# Patient Record
Sex: Female | Born: 1995
Health system: Southern US, Community
[De-identification: ages and names within clinical notes are randomized; demographics above are authoritative.]

## PROBLEM LIST (undated history)

## (undated) ENCOUNTER — Ambulatory Visit (HOSPITAL_COMMUNITY): Disposition: A | Discharge status: Other Acute Inpatient Hospital | Payer: Medicaid Other

## (undated) DIAGNOSIS — N200 Calculus of kidney: Secondary | ICD-10-CM

## (undated) DIAGNOSIS — G43909 Migraine, unspecified, not intractable, without status migrainosus: Secondary | ICD-10-CM

## (undated) DIAGNOSIS — Z87442 Personal history of urinary calculi: Secondary | ICD-10-CM

## (undated) HISTORY — PX: WISDOM TOOTH EXTRACTION: SHX21

## (undated) HISTORY — DX: Migraine, unspecified, not intractable, without status migrainosus: G43.909

## (undated) HISTORY — PX: OTHER SURGICAL HISTORY: SHX169

---
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
## 2016-01-02 ENCOUNTER — Ambulatory Visit (HOSPITAL_COMMUNITY): Payer: Medicaid Other | Attending: Orthopaedic Surgery | Admitting: Physical Therapy

## 2016-01-02 DIAGNOSIS — R252 Cramp and spasm: Secondary | ICD-10-CM | POA: Insufficient documentation

## 2016-01-02 DIAGNOSIS — R293 Abnormal posture: Secondary | ICD-10-CM | POA: Insufficient documentation

## 2016-01-24 ENCOUNTER — Ambulatory Visit (HOSPITAL_COMMUNITY): Payer: Medicaid Other | Admitting: Physical Therapy

## 2016-01-24 DIAGNOSIS — R293 Abnormal posture: Secondary | ICD-10-CM | POA: Diagnosis not present

## 2016-01-24 DIAGNOSIS — R252 Cramp and spasm: Secondary | ICD-10-CM

## 2016-01-24 NOTE — Patient Instructions (Signed)
   SCAPULAR RETRACTIONS  Draw your shoulder blades back and down. It should feel like you are squeezing the muscles between your shoulder blades.  Hold for a count of 2, and then relax; repeat 15 times, twice a day.    Shoulder Rolls  Sit with arm resting on a table (or bed). Start with arm circles, focusing on moving the shoulder while keeping the arm resting on the table. Do a couple seconds circling backwards, then a few forwards.  Repeat 15 times with an up-back-down pattern, twice a day.    RETRACTION / CHIN TUCK  Slowly draw your head back so that your ears line up with your shoulders. It should feel like you are trying to make a "double chin".   Hold for 2 seconds when you are all the way tucked; repeat 15 times, twice a day.     UPPER TRAP STRETCH - HOLDING CHAIR  While sitting in a chair, hold the seat with one hand and bend your head towards the opposite side, then look back towards your hand, for a gentle stretch to the side of the neck.  Example: hold chair with left hand, bend head to your right and turn your head to the left at the same time.  Hold for 30 seconds, and perform twice each side, twice a day.    CORNER STRETCH  While standing at a corner of a wall, place your arms on the walls with elobws bent so that your upper arms are horizontal and your forearms are directed upwards as shown. Take one step forward towards the corner. Bend your front knee until a stretch is felt along the front of your chest and/or shoulders. Your arms should be pointed downward towards the ground.  NOTE: Your legs should control the stretch by bending or straightening your front knee.  Hold for 30 seconds, repeat 2 times, twice a day.

## 2016-01-24 NOTE — Therapy (Signed)
[facility] [address] [place], [place], [address] Phone: [phone]   Fax:  [phone]  Physical Therapy Evaluation  Patient Details  Name: Colleen Howard MRN: [id number] Date of Birth: 11/12/1995 Referring Provider: Eldred MangesMark C Yates   Encounter Date: 01/24/2016      PT End of Session - 01/24/16 0903    Visit Number 1   Number of Visits 6   Date for PT Re-Evaluation 02/21/16   Authorization Type Medicaid (submitted, waiting for auth)   Authorization Time Period 01/24/16 to 02/28/16   PT Start Time 0816   PT Stop Time 0855   PT Time Calculation (min) 39 min   Activity Tolerance Patient tolerated treatment well   Behavior [facility] for tasks assessed/performed      Past Medical History  Diagnosis Date  . Migraine headache     Past Surgical History  Procedure Laterality Date  . Tubes in ear      There were no vitals filed for this visit.       Subjective Assessment - 01/24/16 0819    Subjective Patient reports that she started having neck pain about 6 months ago; she got a shot from the doctor and it didn't help, [facility] [place] [facility] here. Her neck bothers her the most at night; sometimes she will wake up and it is bothering her very badly. She really does not ahve a "best" time for it, as it is always bothering her. She feels like her arm will go numb sometimes.    Pertinent History migranes, no other PMH    How long can you sit comfortably? has to pop it to be comfortable    Patient Stated Goals feel better    Currently in Pain? Yes   Pain Score 6    Pain Location Neck   Pain Orientation Right   Pain Descriptors / Indicators Other (Comment)  uanble to describe clearly, just states " I just have to pop it all the time"    Pain Type Chronic pain   Pain Radiating Towards going down into R shoulder    Pain Onset More than a month ago   Pain Frequency Constant   Aggravating Factors  not  sure    Pain Relieving Factors popping it, ibuprofen    Effect of Pain on Daily Activities no effect on daily activities             [facility] - 01/24/16 0001    Assessment   Medical Diagnosis neck pain    Referring Provider Eldred MangesMark C Yates    Onset Date/Surgical Date --  6 months ago    Next MD Visit May with Dr. Ophelia CharterYates    Precautions   Precautions None   Restrictions   Weight Bearing Restrictions No   Balance Screen   Has the patient fallen in the past 6 months No   Has the patient had a decrease in activity level because of a fear of falling?  No   Is the patient reluctant to leave their home because of a fear of falling?  No   Prior Function   Level of Independence Independent;Independent with basic ADLs;Independent with gait;Independent with transfers   Vocation Part time employment   Vocation Requirements CNA    Leisure none    Sensation   Light Touch Appears Intact   Posture/Postural Control   Posture/Postural Control Postural limitations   Postural  Limitations Rounded Shoulders;Forward head;Increased thoracic kyphosis   AROM   Overall AROM Comments mild scapular dyskinesia with flexion of shoulders    Right Shoulder Flexion --  WFL    Right Shoulder ABduction --  [facility]    Right Shoulder Internal Rotation --  approx T3    Right Shoulder External Rotation --  WFL    Left Shoulder Flexion --  [facility]    Left Shoulder ABduction --  [facility]    Left Shoulder Internal Rotation --  [facility]    Left Shoulder External Rotation --  approx T3    Cervical Flexion 65   Cervical Extension 40   Cervical - Right Side Bend 50  R upper trap pain    Cervical - Left Side Bend 50   Cervical - Right Rotation 90   Cervical - Left Rotation 90   Strength   Right Shoulder Flexion 4+/5   Right Shoulder ABduction 4+/5   Right Shoulder Internal Rotation 4/5   Right Shoulder External Rotation 4/5   Left Shoulder Flexion 4+/5   Left Shoulder ABduction 4+/5   Left Shoulder Internal  Rotation 4/5   Left Shoulder External Rotation 4/5   Cervical Flexion 4+/5   Cervical Extension 4+/5   Cervical - Right Side Bend 4+/5   Cervical - Left Side Bend 4+/5   Palpation   Palpation comment trigger points through scapular stabilziers; large multiple trigger points through bilateral upper traps, R cervical extensors             Trigger point release to bilateral upper traps and thoracic muslces; performed separately from all oteher Skilled interventiosn this session.                PT Education - 01/24/16 0903    Education provided Yes   Education Details Prognosis, plan of care, HEP    Person(s) Educated Patient   Methods Explanation;Demonstration;Handout   Comprehension Verbalized understanding;Returned demonstration;Need further instruction          PT Short Term Goals - 01/24/16 5409    PT SHORT TERM GOAL #1   Title Patietn to demonstrate improved functional posture at least 60% of the time ino rder to assist in reducing pain and improving overall function    Time 3   Period Weeks   Status New   PT SHORT TERM GOAL #2   Title Patient to report no more than 2/10 pain in her cervical area in order to demosntrate general improvement of condition    Time 3   Period Weeks   Status New   PT SHORT TERM GOAL #3   Title Patient to be independent in correct and appropriate use of moist heat in order to assist with increasing self-efficacy in managing condition    Time 3   Period Weeks   Status New   PT SHORT TERM GOAL #4   Title Patient to be independent in correctly and consistently performing appropriate HEP, to be updated PRN    Time 1   Period Days   Status New           PT Long Term Goals - 01/24/16 8119    PT LONG TERM GOAL #1   Title Patient to be able to maintain correct posture during all functional scenarios and sitautions at least 80% of the time in order to continue to assist in improving and managing symptoms    Time 6   Period  Weeks   Status New   PT  LONG TERM GOAL #2   Title Patient to demosntrate reduction in trigger points and muscle knotting in affected groups by at least 70% in order to reduce pain    Time 6   Period Weeks   Status New   PT LONG TERM GOAL #3   Title Patient to report return to full school and work activities with cervical pain no more than 1/10, no need for her to "pop" her neck to achieve relieif in order toa ssist in improving QOL    Time 6   Period Weeks   Status New               Plan - 01/24/16 0905    Clinical Impression Statement Patient arrives with complains of neck pain that she reports has been present for about the past 6 months and that she reports improves when she "pops" her neck. Upon examination, patient demonstrates signficant impairments in functional posture, severe trigger points in bilateral upper traps as well as some less extensive trigger points in thoracic musculature as well; in general, cervical and shoulder ROM appears intact and likely not influencing patient's symptoms at this point. Assigned appropriate HEP, also performed trigger point relesae to upper traps and cervical extensors today. At thsi point recommend brief stint of care with skilled PT servies in order to address functional limitations and assist in reaching optimal level of function.    Rehab Potential Excellent   PT Frequency 1x / week   PT Duration 6 weeks   PT Treatment/Interventions ADLs/Self Care Home Management;Moist Heat;Therapeutic activities;Therapeutic exercise;Patient/family education;Manual techniques;Taping   PT Next Visit Plan review HEP and goals; postural trainnig and strengthening; manual; rpogress HEP    PT Home Exercise Plan given    Consulted and Agree with Plan of Care Patient      Patient will benefit from skilled therapeutic intervention in order to improve the following deficits and impairments:  Pain, Increased fascial restricitons, Increased muscle spasms,  Improper body mechanics, Impaired flexibility, Postural dysfunction  Visit Diagnosis: Abnormal posture - Plan: PT plan of care cert/re-cert  Cramp and spasm - Plan: PT plan of care cert/re-cert     Problem List Patient Active Problem List   Diagnosis Date Noted  . Migraines 07/07/2013    Nedra Hai PT, DPT [phone]  [facility] [address] [place], [place], [address] Phone: [phone]   Fax:  [phone]  Name: Colleen Howard MRN: [id number] Date of Birth: 06/17/1996

## 2016-02-06 ENCOUNTER — Telehealth (HOSPITAL_COMMUNITY): Payer: Self-pay

## 2016-02-06 ENCOUNTER — Ambulatory Visit (HOSPITAL_COMMUNITY): Payer: Medicaid Other | Attending: Orthopaedic Surgery

## 2016-02-06 DIAGNOSIS — R293 Abnormal posture: Secondary | ICD-10-CM

## 2016-02-06 DIAGNOSIS — R252 Cramp and spasm: Secondary | ICD-10-CM | POA: Diagnosis present

## 2016-02-06 NOTE — Telephone Encounter (Signed)
No show, called and spoke to pt.'s mother who stated she had forgotten.  Rescheduled apt for 10:30 am later today.  [address]Colleen Howard, LPTA; CBIS [phone]

## 2016-02-06 NOTE — Therapy (Signed)
[facility] [address] [place], [place], [address] Phone: [phone]   Fax:  [phone]  Physical Therapy Treatment  Patient Details  Name: Colleen Howard MRN: [id number] Date of Birth: 1996/07/09 Referring Provider: Eldred Manges   Encounter Date: 02/06/2016      PT End of Session - 02/06/16 1035    Visit Number 2   Number of Visits 6   Date for PT Re-Evaluation 02/21/16   Authorization Type Medicaid prior authorization from 01/29/2016-03/10/2016 6 units   Authorization Time Period 01/24/16 to 02/28/16   PT Start Time 1030   PT Stop Time 1112   PT Time Calculation (min) 42 min   Activity Tolerance Patient tolerated treatment well   Behavior During Therapy [facility] for tasks assessed/performed      Past Medical History  Diagnosis Date  . Migraine headache     Past Surgical History  Procedure Laterality Date  . Tubes in ear      There were no vitals filed for this visit.      Subjective Assessment - 02/06/16 1030    Subjective Pt stated compliance with HEP 2-3 times a week with no questions concerning exercises.  Current pain scale 5-6/10 Rt side of neck.   Pertinent History migranes, no other PMH    Patient Stated Goals feel better    Currently in Pain? Yes   Pain Score 6    Pain Location Neck   Pain Orientation Right   Pain Descriptors / Indicators Sore;Aching   Pain Type Chronic pain   Pain Radiating Towards Top of Rt shoulder   Pain Onset More than a month ago   Pain Frequency Constant   Aggravating Factors  not sure   Pain Relieving Factors popping it, ibuprofen   Effect of Pain on Daily Activities no effect on daily activities              [facility] Adult PT Treatment/Exercise - 02/06/16 0001    Exercises   Exercises Neck   Neck Exercises: Theraband   Scapula Retraction 10 reps;Red   Shoulder Extension 10 reps;Red   Rows 10 reps;Red   Neck Exercises: Seated   Neck Retraction 15 reps;5 secs   Shoulder  Rolls Backwards;10 reps   Shoulder Rolls Limitations up, back and relax   Postural Training Educated importance and proper landmarks   Other Seated Exercise Scapular retraction   Manual Therapy   Manual Therapy Soft tissue mobilization   Manual therapy comments complete separate from rest of treatment   Soft tissue mobilization Supine positive with LE elevated STM to Rt> Lt Upper traps and levator scapula; position release technqiue to Rt upper trap   Neck Exercises: Stretches   Upper Trapezius Stretch 3 reps;30 seconds   Corner Stretch 3 reps;30 seconds                  PT Short Term Goals - 01/24/16 6962    PT SHORT TERM GOAL #1   Title Patietn to demonstrate improved functional posture at least 60% of the time ino rder to assist in reducing pain and improving overall function    Time 3   Period Weeks   Status New   PT SHORT TERM GOAL #2   Title Patient to report no more than 2/10 pain in her cervical area in order to demosntrate general improvement of condition    Time 3   Period Weeks   Status New   PT SHORT  TERM GOAL #3   Title Patient to be independent in correct and appropriate use of moist heat in order to assist with increasing self-efficacy in managing condition    Time 3   Period Weeks   Status New   PT SHORT TERM GOAL #4   Title Patient to be independent in correctly and consistently performing appropriate HEP, to be updated PRN    Time 1   Period Days   Status New           PT Long Term Goals - 01/24/16 [id number]    PT LONG TERM GOAL #1   Title Patient to be able to maintain correct posture during all functional scenarios and sitautions at least 80% of the time in order to continue to assist in improving and managing symptoms    Time 6   Period Weeks   Status New   PT LONG TERM GOAL #2   Title Patient to demosntrate reduction in trigger points and muscle knotting in affected groups by at least 70% in order to reduce pain    Time 6   Period Weeks    Status New   PT LONG TERM GOAL #3   Title Patient to report return to full school and work activities with cervical pain no more than 1/10, no need for her to "pop" her neck to achieve relieif in order toa ssist in improving QOL    Time 6   Period Weeks   Status New               Plan - 02/06/16 1057    Clinical Impression Statement Reviewed goals, compliance and assured correct technique with HEP and copy of eval given to pt.  Pt educated on importance of proper posture with verbal and tactile cueing for proper landmarks.  Progressed to theraband postural strengthening and UBE backwards for postural strengthening.  Pt encouraged to increase frequency with HEP weekly for maximal benefits.  Ended session with manual soft tissue mobilization to reduce spasms and overall tightness Rt upper trap and levator scapula.  Pt reports pain reduction to 4/10 at end of session.     Rehab Potential Excellent   PT Frequency 1x / week   PT Duration 6 weeks   PT Treatment/Interventions ADLs/Self Care Home Management;Moist Heat;Therapeutic activities;Therapeutic exercise;Patient/family education;Manual techniques;Taping   PT Next Visit Plan Progress postural strengthening with prone exercises next session; stretches for mobility, manual       Patient will benefit from skilled therapeutic intervention in order to improve the following deficits and impairments:  Pain, Increased fascial restricitons, Increased muscle spasms, Improper body mechanics, Impaired flexibility, Postural dysfunction  Visit Diagnosis: Abnormal posture  Cramp and spasm     Problem List Patient Active Problem List   Diagnosis Date Noted  . Migraines 07/07/2013   Becky Saxasey [place], LPTA; CBIS [phone]  Colleen Howard, Colleen Howard 02/06/2016, 12:15 PM  [facility] [address] [place], [place], [address] Phone: [phone]   Fax:  [phone]  Name: Colleen Howard MRN:  [id number] Date of Birth: 01/13/1996

## 2016-02-13 ENCOUNTER — Encounter (HOSPITAL_COMMUNITY): Payer: Medicaid Other

## 2016-02-20 ENCOUNTER — Ambulatory Visit (HOSPITAL_COMMUNITY): Payer: Medicaid Other

## 2016-02-20 ENCOUNTER — Telehealth (HOSPITAL_COMMUNITY): Payer: Self-pay

## 2016-02-20 NOTE — Telephone Encounter (Signed)
No show, called and left about missed apt, included next apt date and time with contact info given.    [address]Earl Zellmer, LPTA; CBIS [phone]

## 2016-02-26 ENCOUNTER — Telehealth: Payer: Self-pay | Admitting: Family Medicine

## 2016-02-26 ENCOUNTER — Other Ambulatory Visit: Payer: Self-pay | Admitting: Nurse Practitioner

## 2016-02-26 NOTE — Telephone Encounter (Signed)
cetirizine (ZYRTEC) 10 MG tablet  [facility] to [facility]

## 2016-02-26 NOTE — Telephone Encounter (Signed)
[facility] via [facility] request)

## 2016-02-27 ENCOUNTER — Telehealth (HOSPITAL_COMMUNITY): Payer: Self-pay

## 2016-02-27 ENCOUNTER — Ambulatory Visit (HOSPITAL_COMMUNITY): Payer: Self-pay | Admitting: Physical Therapy

## 2016-02-27 MED ORDER — CETIRIZINE HCL 10 MG PO TABS: ORAL_TABLET | ORAL | Status: DC

## 2016-02-27 NOTE — Telephone Encounter (Signed)
02/27/16 mom left a message that she was sick

## 2016-02-27 NOTE — Telephone Encounter (Signed)
Spoke with patient's mother and informed her refills were sent over to pharmacy. Patient's mother verbalized understanding.

## 2016-02-28 ENCOUNTER — Ambulatory Visit (INDEPENDENT_AMBULATORY_CARE_PROVIDER_SITE_OTHER): Payer: Medicaid Other

## 2016-02-28 ENCOUNTER — Telehealth: Payer: Self-pay | Admitting: Family Medicine

## 2016-02-28 ENCOUNTER — Ambulatory Visit: Payer: Medicaid Other

## 2016-02-28 DIAGNOSIS — Z3042 Encounter for surveillance of injectable contraceptive: Secondary | ICD-10-CM | POA: Diagnosis not present

## 2016-02-28 MED ORDER — MEDROXYPROGESTERONE ACETATE 150 MG/ML IM SUSP
150.0000 mg | Freq: Once | INTRAMUSCULAR | Status: AC
  Administered 2016-02-28: 150 mg via INTRAMUSCULAR

## 2016-02-28 NOTE — Telephone Encounter (Signed)
Spoke with [facility], mom tried to have filled yesterday, but today is the first day that it could be filled.  I notified Olegario MessierKathy and rescheduled appt.

## 2016-02-28 NOTE — Telephone Encounter (Signed)
Yes those are the correct dates. 

## 2016-02-28 NOTE — Telephone Encounter (Signed)
Patient had appointment today to have depo, but pharmacy states it is too early to have filled so mom cancelled.  The dates for the depo we have down are June 1-June 15.  Are these the correct dates?   ° °[facility] °

## 2016-03-01 ENCOUNTER — Encounter (HOSPITAL_COMMUNITY): Payer: Self-pay

## 2016-03-01 ENCOUNTER — Emergency Department (HOSPITAL_COMMUNITY)
Admission: EM | Admit: 2016-03-01 | Discharge: 2016-03-02 | Disposition: A | Discharge status: Home / Self Care | Payer: Medicaid Other | Attending: Emergency Medicine | Admitting: Emergency Medicine

## 2016-03-01 DIAGNOSIS — B9689 Other specified bacterial agents as the cause of diseases classified elsewhere: Secondary | ICD-10-CM

## 2016-03-01 DIAGNOSIS — R112 Nausea with vomiting, unspecified: Secondary | ICD-10-CM | POA: Insufficient documentation

## 2016-03-01 DIAGNOSIS — N760 Acute vaginitis: Secondary | ICD-10-CM | POA: Insufficient documentation

## 2016-03-01 DIAGNOSIS — R319 Hematuria, unspecified: Secondary | ICD-10-CM | POA: Diagnosis not present

## 2016-03-01 DIAGNOSIS — R1032 Left lower quadrant pain: Secondary | ICD-10-CM | POA: Diagnosis present

## 2016-03-01 DIAGNOSIS — R102 Pelvic and perineal pain: Secondary | ICD-10-CM

## 2016-03-01 LAB — URINE MICROSCOPIC-ADD ON

## 2016-03-01 LAB — URINALYSIS, ROUTINE W REFLEX MICROSCOPIC
BILIRUBIN URINE: NEGATIVE
GLUCOSE, UA: NEGATIVE mg/dL
KETONES UR: NEGATIVE mg/dL
Leukocytes, UA: NEGATIVE
NITRITE: NEGATIVE
PH: 7 (ref 5.0–8.0)
Protein, ur: 30 mg/dL — AB
SPECIFIC GRAVITY, URINE: 1.02 (ref 1.005–1.030)

## 2016-03-01 LAB — WET PREP, GENITAL
SPERM: NONE SEEN
Trich, Wet Prep: NONE SEEN
YEAST WET PREP: NONE SEEN

## 2016-03-01 LAB — PREGNANCY, URINE: Preg Test, Ur: NEGATIVE

## 2016-03-01 MED ORDER — OXYCODONE-ACETAMINOPHEN 5-325 MG PO TABS
1.0000 | ORAL_TABLET | Freq: Once | ORAL | Status: AC
  Administered 2016-03-01: 1 via ORAL
  Filled 2016-03-01: qty 1

## 2016-03-01 NOTE — ED Provider Notes (Signed)
CSN: [id number]     Arrival date & time 03/01/16  2226 History  By signing my name below, I, [facility], attest that this documentation has been prepared under the direction and in the presence of Glynn Octave, MD. Electronically Signed: Randell Patient, ED Scribe. 03/02/2016. 12:58 AM.   Chief Complaint  Patient presents with  . Abdominal Pain    The history is provided by the patient. No language interpreter was used.   HPI Comments: Colleen Howard is a 20 y.o. female who presents to the Emergency Department complaining of constant, unchanging, severe abdominal pain onset 1.5 hours ago while the pt was at work.  Pt states that she felt mild pain in her abdomen after which she vomited once followed by an increase in her abdominal pain and then by multiple episodes of emesis. She reports associated nausea and diarrhea. Pain is worse with emesis and improved by nothing. She notes that she is sexually active and is on birth control shot. She is allergic to Bactrim, codeine, and hydrocodone. Denies similar symptoms in the past. Denies hx of ovarian cysts and pregnancy. Denies vaginal bleeding, vaginal discharge, hematuria, dysuria, fevers, back pain, or any other symptoms currently.   Past Medical History  Diagnosis Date  . Migraine headache    Past Surgical History  Procedure Laterality Date  . Tubes in ear     No family history on file. Social History  Substance Use Topics  . Smoking status: Never Smoker   . Smokeless tobacco: None  . Alcohol Use: No   OB History    No data available     Review of Systems A complete 10 system review of systems was obtained and all systems are negative except as noted in the HPI and PMH.    Allergies  Bactrim; Codeine; and Hydrocodone  Home Medications   Prior to Admission medications   Medication Sig Start Date End Date Taking? Authorizing Provider  azithromycin (ZITHROMAX Z-PAK) 250 MG tablet Take 2 tablets (500 mg) on  Day  1,  followed by 1 tablet (250 mg) once daily on Days 2 through 5. 09/19/15   Babs Sciara, MD  cetirizine (ZYRTEC) 10 MG tablet TAKE 1 TABLET BY MOUTH ONCE DAILY AS NEEDED FOR ALLERGIES. 02/27/16   Merlyn Albert, MD  chlorzoxazone (PARAFON) 500 MG tablet Take 1 tablet (500 mg total) by mouth 3 (three) times daily as needed for muscle spasms. 05/31/15   Campbell Riches, NP  MedroxyPROGESTERone Acetate 150 MG/ML SUSY INJECT INTRAMUSCULARLY EVERY 3 MONTH. 12/13/15   Babs Sciara, MD  meloxicam (MOBIC) 15 MG tablet Take 1 tablet (15 mg total) by mouth daily. Prn pain 06/01/15   Campbell Riches, NP  metroNIDAZOLE (FLAGYL) 500 MG tablet Take 1 tablet (500 mg total) by mouth 3 (three) times daily. 03/02/16   Glynn Octave, MD  ondansetron (ZOFRAN) 4 MG tablet Take 1 tablet (4 mg total) by mouth every 6 (six) hours. 03/02/16   Glynn Octave, MD  ZOLMitriptan (ZOMIG) 2.5 MG tablet Take 1 tablet (2.5 mg total) by mouth as needed for migraine. 07/07/13   Babs Sciara, MD   BP 114/65 mmHg  Pulse 65  Temp(Src) 97.8 F (36.6 C) (Oral)  Resp 18  Ht 5\' 4"  (1.626 m)  Wt 110 lb (49.896 kg)  BMI 18.87 kg/m2  SpO2 100%  LMP  (Approximate) Physical Exam  Constitutional: She is oriented to person, place, and time. She appears well-developed and well-nourished.  No distress.  Appears comfortable.  HENT:  Head: Normocephalic and atraumatic.  Mouth/Throat: Oropharynx is clear and moist. No oropharyngeal exudate.  Eyes: Conjunctivae and EOM are normal. Pupils are equal, round, and reactive to light.  Neck: Normal range of motion. Neck supple.  No meningismus.  Cardiovascular: Normal rate, regular rhythm, normal heart sounds and intact distal pulses.   No murmur heard. Pulmonary/Chest: Effort normal and breath sounds normal. No respiratory distress.  Abdominal: Soft. There is tenderness in the left lower quadrant. There is no rebound, no guarding and no CVA tenderness.  LLQ tenderness. No guarding,.  Rebound, or CVA tenderness.  Genitourinary:  Chaperone present. Normal external genitalia. No vaginal discharge or blood in vaginal vault. No CMT. Left adnexal tenderness. No uterine tenderness.  Musculoskeletal: Normal range of motion. She exhibits no edema or tenderness.  Neurological: She is alert and oriented to person, place, and time. No cranial nerve deficit. She exhibits normal muscle tone. Coordination normal.  No ataxia on finger to nose bilaterally. No pronator drift. 5/5 strength throughout. CN 2-12 intact.Equal grip strength. Sensation intact.   Skin: Skin is warm.  Psychiatric: She has a normal mood and affect. Her behavior is normal.  Nursing note and vitals reviewed.   ED Course  Procedures   DIAGNOSTIC STUDIES: Oxygen Saturation is 100% on RA, normal by my interpretation.    COORDINATION OF CARE: 11:24 PM Will order Percocet, labs, and [place] of transvaginal and abdomen. Will return to perform pelvic exam. Discussed treatment plan with pt at bedside and pt agreed to plan.  11:39 PM Returned to perform pelvic exam. Will order US Pelvis.  Labs Review Labs Reviewed  WET PREP, GENITAL - Abnormal; Notable for the following:    Clue Cells Wet Prep HPF POC PRESENT (*)    WBC, Wet Prep HPF POC MANY (*)    All other components within normal limits  URINALYSIS, ROUTINE W REFLEX MICROSCOPIC (NOT AT [facility]) - Abnormal; Notable for the following:    APPearance CLOUDY (*)    Hgb urine dipstick LARGE (*)    Protein, ur 30 (*)    All other components within normal limits  URINE MICROSCOPIC-ADD ON - Abnormal; Notable for the following:    Squamous Epithelial / LPF 6-30 (*)    Bacteria, UA RARE (*)    All other components within normal limits  PREGNANCY, URINE  GC/CHLAMYDIA PROBE AMP ([place]) NOT AT [facility]    Imaging Review US Transvaginal Non-ob  03/02/2016  CLINICAL DATA:  Acute onset of left lower quadrant abdominal pain, nausea and vomiting. Initial encounter. EXAM:  TRANSABDOMINAL AND TRANSVAGINAL ULTRASOUND OF PELVIS DOPPLER ULTRASOUND OF OVARIES TECHNIQUE: Both transabdominal and transvaginal ultrasound examinations of the pelvis were performed. Transabdominal technique was performed for global imaging of the pelvis including uterus, ovaries, adnexal regions, and pelvic cul-de-sac. It was necessary to proceed with endovaginal exam following the transabdominal exam to visualize the uterus and ovaries in greater detail. Color and duplex Doppler ultrasound was utilized to evaluate blood flow to the ovaries. COMPARISON:  None. FINDINGS: Uterus Measurements: 5.8 x 2.9 x 3.8 cm. No fibroids or other mass visualized. Endometrium Thickness: 0.3 cm.  No focal abnormality visualized. Right ovary Measurements: 2.4 x 1.8 x 2.2 cm. Normal appearance/no adnexal mass. Left ovary Measurements: 3.6 x 1.7 x 2.4 cm. Normal appearance/no adnexal mass. Pulsed Doppler evaluation of both ovaries demonstrates normal low-resistance arterial and venous waveforms. Other findings No abnormal free fluid. IMPRESSION: Unremarkable pelvic ultrasound.  No evidence  for ovarian torsion. Electronically Signed   By: Roanna RaiderJeffery  Chang M.D.   On: 03/02/2016 02:11   [place] Pelvis Complete  03/02/2016  CLINICAL DATA:  Acute onset of left lower quadrant abdominal pain, nausea and vomiting. Initial encounter. EXAM: TRANSABDOMINAL AND TRANSVAGINAL ULTRASOUND OF PELVIS DOPPLER ULTRASOUND OF OVARIES TECHNIQUE: Both transabdominal and transvaginal ultrasound examinations of the pelvis were performed. Transabdominal technique was performed for global imaging of the pelvis including uterus, ovaries, adnexal regions, and pelvic cul-de-sac. It was necessary to proceed with endovaginal exam following the transabdominal exam to visualize the uterus and ovaries in greater detail. Color and duplex Doppler ultrasound was utilized to evaluate blood flow to the ovaries. COMPARISON:  None. FINDINGS: Uterus Measurements: 5.8 x 2.9 x 3.8  cm. No fibroids or other mass visualized. Endometrium Thickness: 0.3 cm.  No focal abnormality visualized. Right ovary Measurements: 2.4 x 1.8 x 2.2 cm. Normal appearance/no adnexal mass. Left ovary Measurements: 3.6 x 1.7 x 2.4 cm. Normal appearance/no adnexal mass. Pulsed Doppler evaluation of both ovaries demonstrates normal low-resistance arterial and venous waveforms. Other findings No abnormal free fluid. IMPRESSION: Unremarkable pelvic ultrasound.  No evidence for ovarian torsion. Electronically Signed   By: Roanna RaiderJeffery  Chang M.D.   On: 03/02/2016 02:11   [place] Art/ven Flow Abd Pelv Doppler  03/02/2016  CLINICAL DATA:  Acute onset of left lower quadrant abdominal pain, nausea and vomiting. Initial encounter. EXAM: TRANSABDOMINAL AND TRANSVAGINAL ULTRASOUND OF PELVIS DOPPLER ULTRASOUND OF OVARIES TECHNIQUE: Both transabdominal and transvaginal ultrasound examinations of the pelvis were performed. Transabdominal technique was performed for global imaging of the pelvis including uterus, ovaries, adnexal regions, and pelvic cul-de-sac. It was necessary to proceed with endovaginal exam following the transabdominal exam to visualize the uterus and ovaries in greater detail. Color and duplex Doppler ultrasound was utilized to evaluate blood flow to the ovaries. COMPARISON:  None. FINDINGS: Uterus Measurements: 5.8 x 2.9 x 3.8 cm. No fibroids or other mass visualized. Endometrium Thickness: 0.3 cm.  No focal abnormality visualized. Right ovary Measurements: 2.4 x 1.8 x 2.2 cm. Normal appearance/no adnexal mass. Left ovary Measurements: 3.6 x 1.7 x 2.4 cm. Normal appearance/no adnexal mass. Pulsed Doppler evaluation of both ovaries demonstrates normal low-resistance arterial and venous waveforms. Other findings No abnormal free fluid. IMPRESSION: Unremarkable pelvic ultrasound.  No evidence for ovarian torsion. Electronically Signed   By: Roanna RaiderJeffery  Chang M.D.   On: 03/02/2016 02:11   Ct Renal Stone Study  03/02/2016   CLINICAL DATA:  Acute onset of severe generalized abdominal pain and vomiting. Nausea and diarrhea. Initial encounter. EXAM: CT ABDOMEN AND PELVIS WITHOUT CONTRAST TECHNIQUE: Multidetector CT imaging of the abdomen and pelvis was performed following the standard protocol without IV contrast. COMPARISON:  Pelvic ultrasound performed earlier today at 1:14 a.m. FINDINGS: The visualized lung bases are clear. The liver and spleen are unremarkable in appearance. The gallbladder is within normal limits. The pancreas and adrenal glands are unremarkable. The kidneys are unremarkable in appearance. There is no evidence of hydronephrosis. No renal or ureteral stones are seen. No perinephric stranding is appreciated. No free fluid is identified. The small bowel is unremarkable in appearance. The stomach is within normal limits. No acute vascular abnormalities are seen. The appendix the appendix is normal in caliber, without evidence of appendicitis. The colon is grossly unremarkable. The bladder is mildly distended and grossly unremarkable. A small urachal remnant is incidentally seen. The uterus is unremarkable in appearance. The ovaries are relatively symmetric. No suspicious adnexal masses are seen.  No inguinal lymphadenopathy is seen. No acute osseous abnormalities are identified. IMPRESSION: No acute abnormality seen within the abdomen or pelvis. Electronically Signed   By: Roanna Raider M.D.   On: 03/02/2016 03:24   I have personally reviewed and evaluated these images and lab results as part of my medical decision-making.   EKG Interpretation None      MDM   Final diagnoses:  Pelvic pain in female  Hematuria  Nausea and vomiting, vomiting of unspecified type  Bacterial vaginosis   Sudden onset left-sided pelvic pain with nausea and vomiting. No urinary or vaginal symptoms.   Ultrasound will need to obtained to rule out torsion. HCG negative.  UA with hematuria. Clue cells on wet prep.  Ultrasound  was negative for torsion or other pathology. Pain is improved. Patient is tolerating by mouth. Given hematuria on urinalysis, CT scan obtained looking for kidney stone.  CT is negative. Consider passed kidney stone. No evidence of ovarian torsion or TOA. No evidence of UTI. Patient is tolerating by mouth without vomiting in the ED. Treat symptomatically. Treat BV. Follow-up with PCP. Return precautions discussed.   I personally performed the services described in this documentation, which was scribed in my presence. The recorded information has been reviewed and is accurate.       Glynn Octave, MD 03/02/16 [phone]

## 2016-03-01 NOTE — ED Notes (Signed)
Patient complaining of nausea and vomiting.  She was working at [employer] and started having severe abdominal pain, then started vomiting.  Started suddenly.  Left lower abdominal pain that started suddenly.  Patient is sexually active, denies pregnancy.  No history of ovarian cysts.

## 2016-03-02 ENCOUNTER — Emergency Department (HOSPITAL_COMMUNITY): Payer: Medicaid Other

## 2016-03-02 MED ORDER — ONDANSETRON HCL 4 MG PO TABS: 4.0000 mg | ORAL_TABLET | Freq: Four times a day (QID) | ORAL | Status: DC

## 2016-03-02 MED ORDER — METRONIDAZOLE 500 MG PO TABS
500.0000 mg | ORAL_TABLET | Freq: Three times a day (TID) | ORAL | Status: DC
Start: 2016-03-02 — End: 2017-06-30

## 2016-03-02 NOTE — ED Notes (Signed)
Pt provided with ginger ale 

## 2016-03-02 NOTE — ED Notes (Signed)
No vomiting after drinking ginger ale

## 2016-03-02 NOTE — Discharge Instructions (Signed)
Abdominal Pain, Adult It is possible you passed a kidney stone. The blood supply to your ovary is normal.Take the nausea medication as prescribed. Follow-up with your doctor. Return to the ED if you develop new or worsening symptoms. Many things can cause abdominal pain. Usually, abdominal pain is not caused by a disease and will improve without treatment. It can often be observed and treated at home. Your health care provider will do a physical exam and possibly order blood tests and X-rays to help determine the seriousness of your pain. However, in many cases, more time must pass before a clear cause of the pain can be found. Before that point, your health care provider may not know if you need more testing or further treatment. HOME CARE INSTRUCTIONS Monitor your abdominal pain for any changes. The following actions may help to alleviate any discomfort you are experiencing:  Only take over-the-counter or prescription medicines as directed by your health care provider.  Do not take laxatives unless directed to do so by your health care provider.  Try a clear liquid diet (broth, tea, or water) as directed by your health care provider. Slowly move to a bland diet as tolerated. SEEK MEDICAL CARE IF:  You have unexplained abdominal pain.  You have abdominal pain associated with nausea or diarrhea.  You have pain when you urinate or have a bowel movement.  You experience abdominal pain that wakes you in the night.  You have abdominal pain that is worsened or improved by eating food.  You have abdominal pain that is worsened with eating fatty foods.  You have a fever. SEEK IMMEDIATE MEDICAL CARE IF:  Your pain does not go away within 2 hours.  You keep throwing up (vomiting).  Your pain is felt only in portions of the abdomen, such as the right side or the left lower portion of the abdomen.  You pass bloody or black tarry stools. MAKE SURE YOU:  Understand these instructions.  Will  watch your condition.  Will get help right away if you are not doing well or get worse.   This information is not intended to replace advice given to you by your health care provider. Make sure you discuss any questions you have with your health care provider.   Document Released: 06/25/2005 Document Revised: 06/06/2015 Document Reviewed: 05/25/2013 Elsevier Interactive Patient Education [employer]2016 Elsevier Inc.

## 2016-03-03 ENCOUNTER — Telehealth (HOSPITAL_COMMUNITY): Payer: Self-pay

## 2016-03-03 ENCOUNTER — Ambulatory Visit (HOSPITAL_COMMUNITY): Payer: Medicaid Other | Admitting: Physical Therapy

## 2016-03-03 LAB — GC/CHLAMYDIA PROBE AMP (~~LOC~~) NOT AT ARMC
Chlamydia: NEGATIVE
NEISSERIA GONORRHEA: NEGATIVE

## 2016-03-03 NOTE — Telephone Encounter (Signed)
03/03/16 mom called to cx said that she has kidney stones

## 2016-05-12 ENCOUNTER — Other Ambulatory Visit: Payer: Self-pay | Admitting: Nurse Practitioner

## 2016-05-15 ENCOUNTER — Ambulatory Visit (INDEPENDENT_AMBULATORY_CARE_PROVIDER_SITE_OTHER): Payer: Medicaid Other

## 2016-05-15 ENCOUNTER — Other Ambulatory Visit: Payer: Self-pay | Admitting: Nurse Practitioner

## 2016-05-15 DIAGNOSIS — Z3042 Encounter for surveillance of injectable contraceptive: Secondary | ICD-10-CM | POA: Diagnosis not present

## 2016-05-15 MED ORDER — MEDROXYPROGESTERONE ACETATE 150 MG/ML IM SUSP
150.0000 mg | Freq: Once | INTRAMUSCULAR | Status: AC
  Administered 2016-05-15: 150 mg via INTRAMUSCULAR

## 2016-05-15 MED ORDER — CETIRIZINE HCL 10 MG PO TABS: ORAL_TABLET | ORAL | 5 refills | Status: DC

## 2016-06-24 ENCOUNTER — Ambulatory Visit: Payer: Medicaid Other

## 2016-07-29 ENCOUNTER — Other Ambulatory Visit: Payer: Self-pay | Admitting: Nurse Practitioner

## 2016-07-31 ENCOUNTER — Ambulatory Visit: Payer: Medicaid Other | Admitting: Family Medicine

## 2016-07-31 ENCOUNTER — Ambulatory Visit: Payer: Medicaid Other

## 2016-07-31 ENCOUNTER — Ambulatory Visit (INDEPENDENT_AMBULATORY_CARE_PROVIDER_SITE_OTHER): Payer: Medicaid Other | Admitting: *Deleted

## 2016-07-31 DIAGNOSIS — Z309 Encounter for contraceptive management, unspecified: Secondary | ICD-10-CM | POA: Diagnosis not present

## 2016-07-31 MED ORDER — MEDROXYPROGESTERONE ACETATE 150 MG/ML IM SUSP
150.0000 mg | Freq: Once | INTRAMUSCULAR | Status: AC
  Administered 2016-07-31: 150 mg via INTRAMUSCULAR

## 2016-10-16 ENCOUNTER — Ambulatory Visit: Payer: Medicaid Other

## 2016-10-16 ENCOUNTER — Other Ambulatory Visit: Payer: Self-pay | Admitting: Nurse Practitioner

## 2016-11-24 ENCOUNTER — Ambulatory Visit: Payer: Medicaid Other | Admitting: Family Medicine

## 2016-11-26 MED FILL — POLYMYXIN B/TMP EYE DROPS: 10000-0.1 | 16 days supply | Qty: 10 | Fill #0

## 2016-12-08 MED FILL — ZYLET EYE DROPS: 0.5-0.3 | 25 days supply | Qty: 5 | Fill #0

## 2017-01-15 ENCOUNTER — Ambulatory Visit (INDEPENDENT_AMBULATORY_CARE_PROVIDER_SITE_OTHER): Payer: Medicaid Other

## 2017-01-15 DIAGNOSIS — Z3042 Encounter for surveillance of injectable contraceptive: Secondary | ICD-10-CM | POA: Diagnosis not present

## 2017-01-15 LAB — POCT URINE PREGNANCY: PREG TEST UR: NEGATIVE

## 2017-01-15 MED ORDER — MEDROXYPROGESTERONE ACETATE 150 MG/ML IM SUSP
150.0000 mg | Freq: Once | INTRAMUSCULAR | Status: AC
  Administered 2017-01-15: 150 mg via INTRAMUSCULAR

## 2017-04-03 ENCOUNTER — Ambulatory Visit: Payer: Medicaid Other

## 2017-04-07 ENCOUNTER — Ambulatory Visit (INDEPENDENT_AMBULATORY_CARE_PROVIDER_SITE_OTHER): Payer: Medicaid Other | Admitting: *Deleted

## 2017-04-07 DIAGNOSIS — Z309 Encounter for contraceptive management, unspecified: Secondary | ICD-10-CM

## 2017-04-07 MED ORDER — MEDROXYPROGESTERONE ACETATE 150 MG/ML IM SUSP
150.0000 mg | Freq: Once | INTRAMUSCULAR | Status: AC
  Administered 2017-04-07: 150 mg via INTRAMUSCULAR

## 2017-04-08 ENCOUNTER — Telehealth: Payer: Self-pay | Admitting: *Deleted

## 2017-04-08 NOTE — Telephone Encounter (Signed)
Pt came in to get her depo provera shot and she states she heard depo provera could cause stress fractures. She wants to know if she should get on birth control pill. Please advise. [facility].  

## 2017-04-09 NOTE — Telephone Encounter (Signed)
Eber Jonesarolyn, I cannot close because it is your incomplete note. It will not let me edit your note.

## 2017-04-09 NOTE — Telephone Encounter (Signed)
Left message to return call 

## 2017-04-09 NOTE — Telephone Encounter (Signed)
Discussed with pt. Pt verbalized understanding.  °

## 2017-04-09 NOTE — Telephone Encounter (Signed)
Toniann FailWendy, cancel incomplete note. I reviewed all the latest data. First, there is no evidence that use of Depo Provera for someone your age increases risk of osteoporosis at a later age. Gynecologists say benefits greatly outweigh any risks. Although there is a warning about bone loss, it is increased in certain groups of women but does not apply to you. Hope this helps.

## 2017-06-30 ENCOUNTER — Encounter: Payer: Self-pay | Admitting: Family Medicine

## 2017-06-30 ENCOUNTER — Ambulatory Visit (INDEPENDENT_AMBULATORY_CARE_PROVIDER_SITE_OTHER): Payer: Self-pay | Admitting: Family Medicine

## 2017-06-30 VITALS — BP 112/78 | Temp 98.5°F | Ht 63.0 in | Wt 111.0 lb

## 2017-06-30 DIAGNOSIS — B9689 Other specified bacterial agents as the cause of diseases classified elsewhere: Secondary | ICD-10-CM

## 2017-06-30 DIAGNOSIS — J029 Acute pharyngitis, unspecified: Secondary | ICD-10-CM

## 2017-06-30 DIAGNOSIS — J019 Acute sinusitis, unspecified: Secondary | ICD-10-CM

## 2017-06-30 LAB — POCT RAPID STREP A (OFFICE): Rapid Strep A Screen: NEGATIVE

## 2017-06-30 MED ORDER — AZITHROMYCIN 250 MG PO TABS: ORAL_TABLET | ORAL | 0 refills | Status: DC

## 2017-06-30 NOTE — Progress Notes (Signed)
Subjective:     Patient ID: Colleen Howard, female   DOB: [birth year], 21 y.o.   MRN: [id number]  Sore Throat     Patient states she started having symptoms of sore throat three day. It hurts to swallow.Ran fever yesterday.Taking allergy medications and Advil for it and they have not helped; No other concerns. Review of Systems Took advil and allergy meds, did not help  Low gr fever   Gunky nose   Front   [employer]     Results for orders placed or performed in visit on 06/30/17  POCT rapid strep A  Result Value Ref Range   Rapid Strep A Screen Negative Negative     Objective:   Physical Exam lert active mild nasal congestion thanks erythematous tender anterior nodes lungs clear. Heart    Assessment:     Rhinosinusitis/pharyngitis with cervical lymphadenitis plan antibiotics prescribed symptom care discussed    Plan:

## 2017-07-01 LAB — PLEASE NOTE

## 2017-07-01 LAB — STREP A DNA PROBE: Strep Gp A Direct, DNA Probe: POSITIVE — AB

## 2017-07-02 ENCOUNTER — Ambulatory Visit (INDEPENDENT_AMBULATORY_CARE_PROVIDER_SITE_OTHER): Payer: Medicaid Other

## 2017-07-02 DIAGNOSIS — Z3042 Encounter for surveillance of injectable contraceptive: Secondary | ICD-10-CM

## 2017-07-02 MED ORDER — MEDROXYPROGESTERONE ACETATE 150 MG/ML IM SUSP
150.0000 mg | Freq: Once | INTRAMUSCULAR | Status: AC
  Administered 2017-07-02: 150 mg via INTRAMUSCULAR

## 2017-09-17 ENCOUNTER — Ambulatory Visit: Payer: Medicaid Other

## 2017-09-28 ENCOUNTER — Other Ambulatory Visit: Payer: Self-pay | Admitting: Nurse Practitioner

## 2017-09-28 ENCOUNTER — Telehealth: Payer: Self-pay | Admitting: Family Medicine

## 2017-09-28 NOTE — Telephone Encounter (Signed)
Let's do 

## 2017-09-28 NOTE — Telephone Encounter (Signed)
Coming in Wednesday for Depo shot and needs rx sent to [facility].  °

## 2017-09-28 NOTE — Telephone Encounter (Signed)
Please advise 

## 2017-09-28 NOTE — Telephone Encounter (Signed)
Sent in the rx

## 2017-09-30 ENCOUNTER — Ambulatory Visit (INDEPENDENT_AMBULATORY_CARE_PROVIDER_SITE_OTHER): Payer: Medicaid Other

## 2017-09-30 DIAGNOSIS — Z3042 Encounter for surveillance of injectable contraceptive: Secondary | ICD-10-CM

## 2017-09-30 DIAGNOSIS — Z3040 Encounter for surveillance of contraceptives, unspecified: Secondary | ICD-10-CM

## 2017-09-30 MED ORDER — MEDROXYPROGESTERONE ACETATE 150 MG/ML IM SUSP
150.0000 mg | Freq: Once | INTRAMUSCULAR | Status: AC
  Administered 2017-09-30: 150 mg via INTRAMUSCULAR

## 2017-10-13 ENCOUNTER — Emergency Department (HOSPITAL_COMMUNITY)
Admission: EM | Admit: 2017-10-13 | Discharge: 2017-10-13 | Disposition: A | Discharge status: Home / Self Care | Payer: Self-pay | Attending: Emergency Medicine | Admitting: Emergency Medicine

## 2017-10-13 ENCOUNTER — Encounter (HOSPITAL_COMMUNITY): Payer: Self-pay | Admitting: Emergency Medicine

## 2017-10-13 ENCOUNTER — Emergency Department (HOSPITAL_COMMUNITY): Payer: Self-pay

## 2017-10-13 ENCOUNTER — Other Ambulatory Visit: Payer: Self-pay

## 2017-10-13 DIAGNOSIS — Y929 Unspecified place or not applicable: Secondary | ICD-10-CM | POA: Insufficient documentation

## 2017-10-13 DIAGNOSIS — S00511A Abrasion of lip, initial encounter: Secondary | ICD-10-CM | POA: Insufficient documentation

## 2017-10-13 DIAGNOSIS — Z79899 Other long term (current) drug therapy: Secondary | ICD-10-CM | POA: Insufficient documentation

## 2017-10-13 DIAGNOSIS — S060X1A Concussion with loss of consciousness of 30 minutes or less, initial encounter: Secondary | ICD-10-CM | POA: Insufficient documentation

## 2017-10-13 DIAGNOSIS — Y999 Unspecified external cause status: Secondary | ICD-10-CM | POA: Insufficient documentation

## 2017-10-13 DIAGNOSIS — Y939 Activity, unspecified: Secondary | ICD-10-CM | POA: Insufficient documentation

## 2017-10-13 DIAGNOSIS — S0990XA Unspecified injury of head, initial encounter: Secondary | ICD-10-CM | POA: Insufficient documentation

## 2017-10-13 MED ORDER — ACETAMINOPHEN 325 MG PO TABS
650.0000 mg | ORAL_TABLET | Freq: Once | ORAL | Status: AC
  Administered 2017-10-13: 650 mg via ORAL

## 2017-10-13 MED ORDER — ACETAMINOPHEN 325 MG PO TABS
ORAL_TABLET | ORAL | Status: AC
  Filled 2017-10-13: qty 2
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
## 2017-10-16 ENCOUNTER — Encounter: Payer: Self-pay | Admitting: Family Medicine

## 2017-10-16 ENCOUNTER — Telehealth: Payer: Self-pay | Admitting: Family Medicine

## 2017-10-16 NOTE — Telephone Encounter (Signed)
error 

## 2017-11-24 ENCOUNTER — Encounter: Payer: Self-pay | Admitting: Family Medicine

## 2017-11-24 ENCOUNTER — Ambulatory Visit (INDEPENDENT_AMBULATORY_CARE_PROVIDER_SITE_OTHER): Payer: Self-pay | Admitting: Family Medicine

## 2017-11-24 VITALS — Temp 98.2°F | Ht 63.0 in | Wt 120.2 lb

## 2017-11-24 DIAGNOSIS — J019 Acute sinusitis, unspecified: Secondary | ICD-10-CM

## 2017-11-24 DIAGNOSIS — B9689 Other specified bacterial agents as the cause of diseases classified elsewhere: Secondary | ICD-10-CM

## 2017-11-24 MED ORDER — AMOXICILLIN 500 MG PO CAPS: 500.0000 mg | ORAL_CAPSULE | Freq: Three times a day (TID) | ORAL | 0 refills | Status: DC

## 2017-11-24 NOTE — Progress Notes (Signed)
   Subjective:    Patient ID: Colleen Howard, female    DOB: 05/09/1996, 22 y.o.   MRN: [id number]  Cough  This is a new problem. The current episode started in the past 7 days. Associated symptoms include a fever, headaches, nasal congestion and a sore throat. Associated symptoms comments: diarrhea. Treatments tried: advil and allergy med.    Pos prod cough   Whole head hurting    Work at [employer]  36 hrs per week  Energy  Down sig  ppetite also down sig      Review of Systems  Constitutional: Positive for fever.  HENT: Positive for sore throat.   Respiratory: Positive for cough.   Neurological: Positive for headaches.       Objective:   Physical Exam  Alert, mild malaise. Hydration good Vitals stable. frontal/ maxillary tenderness evident positive nasal congestion. pharynx normal neck supple  lungs clear/no crackles or wheezes. heart regular in rhythm       Assessment & Plan:  Impression rhinosinusitis likely post viral, discussed with patient. plan antibiotics prescribed. Questions answered. Symptomatic care discussed. warning signs discussed. WSL

## 2017-12-16 ENCOUNTER — Ambulatory Visit (INDEPENDENT_AMBULATORY_CARE_PROVIDER_SITE_OTHER): Payer: Medicaid Other | Admitting: *Deleted

## 2017-12-16 DIAGNOSIS — Z309 Encounter for contraceptive management, unspecified: Secondary | ICD-10-CM

## 2017-12-16 DIAGNOSIS — Z3042 Encounter for surveillance of injectable contraceptive: Secondary | ICD-10-CM | POA: Diagnosis not present

## 2017-12-16 MED ORDER — MEDROXYPROGESTERONE ACETATE 150 MG/ML IM SUSP
150.0000 mg | Freq: Once | INTRAMUSCULAR | Status: AC
  Administered 2017-12-16: 150 mg via INTRAMUSCULAR

## 2018-03-01 ENCOUNTER — Other Ambulatory Visit: Payer: Self-pay | Admitting: Family Medicine

## 2018-03-03 ENCOUNTER — Ambulatory Visit: Payer: Medicaid Other

## 2018-03-04 ENCOUNTER — Ambulatory Visit: Payer: Medicaid Other

## 2018-03-05 ENCOUNTER — Ambulatory Visit (INDEPENDENT_AMBULATORY_CARE_PROVIDER_SITE_OTHER): Payer: Medicaid Other

## 2018-03-05 DIAGNOSIS — Z3042 Encounter for surveillance of injectable contraceptive: Secondary | ICD-10-CM | POA: Diagnosis not present

## 2018-03-05 DIAGNOSIS — Z309 Encounter for contraceptive management, unspecified: Secondary | ICD-10-CM

## 2018-03-05 MED ORDER — MEDROXYPROGESTERONE ACETATE 150 MG/ML IM SUSP
150.0000 mg | Freq: Once | INTRAMUSCULAR | Status: AC
  Administered 2018-03-05: 150 mg via INTRAMUSCULAR

## 2018-03-05 NOTE — Patient Instructions (Signed)
Next shot due Aug 23-Sept 6

## 2018-05-21 ENCOUNTER — Ambulatory Visit: Payer: Medicaid Other

## 2018-06-09 ENCOUNTER — Ambulatory Visit: Payer: Self-pay | Admitting: Family Medicine

## 2018-06-16 ENCOUNTER — Encounter: Payer: Self-pay | Admitting: Family Medicine

## 2018-06-16 ENCOUNTER — Ambulatory Visit (INDEPENDENT_AMBULATORY_CARE_PROVIDER_SITE_OTHER): Payer: Medicaid Other | Admitting: Family Medicine

## 2018-06-16 VITALS — BP 108/66 | Temp 99.6°F | Ht 63.0 in | Wt 112.0 lb

## 2018-06-16 DIAGNOSIS — J020 Streptococcal pharyngitis: Secondary | ICD-10-CM

## 2018-06-16 DIAGNOSIS — J029 Acute pharyngitis, unspecified: Secondary | ICD-10-CM

## 2018-06-16 LAB — POCT RAPID STREP A (OFFICE): Rapid Strep A Screen: POSITIVE — AB

## 2018-06-16 MED ORDER — AMOXICILLIN 500 MG PO CAPS: 500.0000 mg | ORAL_CAPSULE | Freq: Three times a day (TID) | ORAL | 0 refills | Status: DC

## 2018-06-16 NOTE — Patient Instructions (Signed)

## 2018-06-16 NOTE — Progress Notes (Signed)
   Subjective:    Patient ID: Colleen Howard, female    DOB: 07/09/1996, 22 y.o.   MRN: [id number]  Sore Throat   This is a new problem. Episode onset: 2 days. Maximum temperature: low grade 99.6 today. Associated symptoms include headaches and trouble swallowing (d/t pain). Pertinent negatives include no congestion, coughing, ear pain or shortness of breath. Treatments tried: sore throat spray, cough drops, salt water.   Reports sore throat since Monday night, chills, has not checked temp at home. 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 Reports trouble swallowing d/t pain.   Results for orders placed or performed in visit on 06/16/18  POCT rapid strep A  Result Value Ref Range   Rapid Strep A Screen Positive (A) Negative     Review of Systems  Constitutional: Positive for chills.  HENT: Positive for sore throat and trouble swallowing (d/t pain). Negative for congestion and ear pain.   Respiratory: Negative for cough and shortness of breath.   Neurological: Positive for headaches.       Objective:   Physical Exam  Constitutional: She is oriented to person, place, and time. She appears well-developed and well-nourished. She appears ill.  HENT:  Head: Normocephalic and atraumatic.  Right Ear: Tympanic membrane normal.  Left Ear: Tympanic membrane normal.  Mouth/Throat: Uvula is midline. Oropharyngeal exudate, posterior oropharyngeal edema and posterior oropharyngeal erythema present. Tonsils are 2+ on the right. Tonsils are 2+ on the left. Tonsillar exudate.  Eyes: Pupils are equal, round, and reactive to light.  Neck: Neck supple.  Cardiovascular: Normal rate, regular rhythm and normal heart sounds.  Pulmonary/Chest: Effort normal and breath sounds normal. She has no wheezes.  Lymphadenopathy:    She has cervical adenopathy.  Neurological: She is alert and oriented to person, place, and time.  Skin: Skin is warm and dry. No rash noted.  Psychiatric: She has a normal mood and affect.    Nursing note and vitals reviewed.   Rapid strep test: Positive      Assessment & Plan:  Impression strep throat.  Diagnosis discussed.  Antibiotics prescribed.  Symptom care discussed.  Warning signs discussed.  Greater than 50% of this 15 minute face to face visit was spent in counseling and discussion and coordination of care regarding the above diagnosis/diagnosies

## 2018-07-05 ENCOUNTER — Encounter (HOSPITAL_COMMUNITY): Payer: Self-pay | Admitting: Physical Therapy

## 2018-07-05 NOTE — Therapy (Signed)
[name], [place], [address] Phone: [phone]   Fax:  [phone]  Patient Details  Name: Colleen Howard MRN: [id number] Date of Birth: 1996-01-08 Referring Provider:  No ref. provider found  Encounter Date: 07/05/2018  PHYSICAL THERAPY DISCHARGE SUMMARY  Visits from Start of Care: 2  Current functional level related to goals / functional outcomes: DC    Remaining deficits: Unknown    Education / Equipment: None  Plan: Patient agrees to discharge.  Patient goals were not met. Patient is being discharged due to not returning since the last visit.  ?????      Deniece Ree PT, DPT, CBIS  Supplemental Physical Therapist [facility]    Pager [phone] Acute Rehab Office [place] [address] [place], [place], [address] Phone: [phone]   Fax:  [phone]

## 2018-07-09 ENCOUNTER — Other Ambulatory Visit: Payer: Self-pay | Admitting: *Deleted

## 2018-07-09 ENCOUNTER — Telehealth: Payer: Self-pay | Admitting: Family Medicine

## 2018-07-09 MED ORDER — SULFACETAMIDE SODIUM 10 % OP SOLN: OPHTHALMIC | 0 refills | Status: DC

## 2018-07-09 NOTE — Telephone Encounter (Signed)
Mom calling checking on status of eye drops being called in.

## 2018-07-09 NOTE — Telephone Encounter (Signed)
Patient woke up this morning with her right eye puffy and pink.Can you call in some eye drops to [facility].

## 2018-07-09 NOTE — Telephone Encounter (Signed)
Tried to contact patient. Pt voicemail is full. No DPR on file to speak with mom.

## 2018-07-09 NOTE — Telephone Encounter (Signed)
Pt eye matted this morning, red and irritated. Sulfacetamide eye drops 2 drops to affected eye qid for 3 -5 days sent to [facility] per dr Lorin Picket. Pt notified to follow up if worse or not getting better.

## 2018-08-11 ENCOUNTER — Encounter (HOSPITAL_COMMUNITY): Payer: Self-pay

## 2018-08-11 ENCOUNTER — Other Ambulatory Visit: Payer: Self-pay

## 2018-08-11 ENCOUNTER — Emergency Department (HOSPITAL_COMMUNITY)
Admission: EM | Admit: 2018-08-11 | Discharge: 2018-08-11 | Disposition: A | Discharge status: Home / Self Care | Payer: Self-pay | Attending: Emergency Medicine | Admitting: Emergency Medicine

## 2018-08-11 ENCOUNTER — Emergency Department (HOSPITAL_COMMUNITY): Payer: Self-pay

## 2018-08-11 DIAGNOSIS — N3001 Acute cystitis with hematuria: Secondary | ICD-10-CM | POA: Insufficient documentation

## 2018-08-11 HISTORY — DX: Calculus of kidney: N20.0

## 2018-08-11 LAB — URINALYSIS, ROUTINE W REFLEX MICROSCOPIC
Bilirubin Urine: NEGATIVE
GLUCOSE, UA: NEGATIVE mg/dL
Ketones, ur: NEGATIVE mg/dL
NITRITE: NEGATIVE
PROTEIN: 100 mg/dL — AB
SPECIFIC GRAVITY, URINE: 1.017 (ref 1.005–1.030)
pH: 6 (ref 5.0–8.0)

## 2018-08-11 LAB — CBC WITH DIFFERENTIAL/PLATELET
Abs Immature Granulocytes: 0.06 10*3/uL (ref 0.00–0.07)
BASOS PCT: 0 %
Basophils Absolute: 0 10*3/uL (ref 0.0–0.1)
Eosinophils Absolute: 0.4 10*3/uL (ref 0.0–0.5)
Eosinophils Relative: 2 %
HCT: 42.4 % (ref 36.0–46.0)
HEMOGLOBIN: 14 g/dL (ref 12.0–15.0)
IMMATURE GRANULOCYTES: 0 %
LYMPHS PCT: 13 %
Lymphs Abs: 2.3 10*3/uL (ref 0.7–4.0)
MCH: 30.4 pg (ref 26.0–34.0)
MCHC: 33 g/dL (ref 30.0–36.0)
MCV: 92 fL (ref 80.0–100.0)
MONOS PCT: 7 %
Monocytes Absolute: 1.2 10*3/uL — ABNORMAL HIGH (ref 0.1–1.0)
NEUTROS ABS: 13.6 10*3/uL — AB (ref 1.7–7.7)
NEUTROS PCT: 78 %
PLATELETS: 281 10*3/uL (ref 150–400)
RBC: 4.61 MIL/uL (ref 3.87–5.11)
RDW: 12.2 % (ref 11.5–15.5)
WBC: 17.6 10*3/uL — AB (ref 4.0–10.5)
nRBC: 0 % (ref 0.0–0.2)

## 2018-08-11 LAB — BASIC METABOLIC PANEL
ANION GAP: 9 (ref 5–15)
BUN: 12 mg/dL (ref 6–20)
CO2: 24 mmol/L (ref 22–32)
Calcium: 9.1 mg/dL (ref 8.9–10.3)
Chloride: 106 mmol/L (ref 98–111)
Creatinine, Ser: 0.75 mg/dL (ref 0.44–1.00)
GFR calc Af Amer: >60 mL/min (ref >60)
GFR calc non Af Amer: >60 mL/min (ref >60)
Glucose, Bld: 110 mg/dL — ABNORMAL HIGH (ref 70–99)
Potassium: 3.7 mmol/L (ref 3.5–5.1)
SODIUM: 139 mmol/L (ref 135–145)

## 2018-08-11 LAB — POC URINE PREG, ED: Preg Test, Ur: NEGATIVE

## 2018-08-11 MED ORDER — KETOROLAC TROMETHAMINE 30 MG/ML IJ SOLN
30.0000 mg | Freq: Once | INTRAMUSCULAR | Status: AC
  Administered 2018-08-11: 30 mg via INTRAVENOUS
  Filled 2018-08-11: qty 1

## 2018-08-11 MED ORDER — SODIUM CHLORIDE 0.9 % IV SOLN
1.0000 g | Freq: Once | INTRAVENOUS | Status: AC
  Administered 2018-08-11: 1 g via INTRAVENOUS
  Filled 2018-08-11: qty 10

## 2018-08-11 MED ORDER — PHENAZOPYRIDINE HCL 200 MG PO TABS: 200.0000 mg | ORAL_TABLET | Freq: Three times a day (TID) | ORAL | 0 refills | Status: DC

## 2018-08-11 MED ORDER — CEPHALEXIN 500 MG PO CAPS: 500.0000 mg | ORAL_CAPSULE | Freq: Three times a day (TID) | ORAL | 0 refills | Status: DC

## 2018-08-11 MED ORDER — ONDANSETRON HCL 4 MG/2ML IJ SOLN
4.0000 mg | Freq: Once | INTRAMUSCULAR | Status: AC
  Administered 2018-08-11: 4 mg via INTRAVENOUS
  Filled 2018-08-11: qty 2

## 2018-08-11 MED ORDER — PHENAZOPYRIDINE HCL 100 MG PO TABS
200.0000 mg | ORAL_TABLET | Freq: Once | ORAL | Status: AC
  Administered 2018-08-11: 200 mg via ORAL
  Filled 2018-08-11: qty 2

## 2018-08-11 NOTE — Discharge Instructions (Signed)
Drink plenty of fluids. Take ibuprofen 400 mg and/or acetaminophen 650 mg 4 times a day for pain. Take the pyridium for pain on urination (it will turn your urine orange).  Take the antibiotics until gone. Recheck if you get a fever, have uncontrolled vomiting or you seem worse.

## 2018-08-11 NOTE — ED Provider Notes (Signed)
[facility] EMERGENCY DEPARTMENT Provider Note   CSN: [id number] Arrival date & time: 08/11/18  0515  Time seen 06:30 AM   History   Chief Complaint Chief Complaint  Patient presents with  . Flank Pain    Right     HPI Colleen Howard is a 21 y.o. female.  HPI she states yesterday morning around 2 AM she was awakened from sleep with periumbilical pain, dysuria, frequency, but no hematuria.  This persisted through the day and she states but at 3 AM she was awakened from sleep with right flank pain.  She denies nausea, vomiting, or fever.  She states she started having chills after the pain got worse this morning.  She states movement makes the pain worse.  Nothing makes it feel better.  She denies any known injury and states she has not had any change in her activity.  She denies lifting anything heavier than usual.  She states it feels like when she had kidney stones about 2 years ago.  She states she is had them twice and she was able to pass them on her own.  Patient was on Depo-Provera however she missed her last shot 2 months ago.  She states she has been using condoms with sex.  PCP Merlyn Albert, MD   Past Medical History:  Diagnosis Date  . Kidney stone   . Migraine headache     Patient Active Problem List   Diagnosis Date Noted  . Migraines 07/07/2013    Past Surgical History:  Procedure Laterality Date  . tubes in ear       OB History   None      Home Medications    Prior to Admission medications   Medication Sig Start Date End Date Taking? Authorizing Provider  amoxicillin (AMOXIL) 500 MG capsule Take 1 capsule (500 mg total) by mouth 3 (three) times daily. 06/16/18   Babs Sciara, MD  cephALEXin (KEFLEX) 500 MG capsule Take 1 capsule (500 mg total) by mouth 3 (three) times daily. 08/11/18   Devoria Albe, MD  medroxyPROGESTERone Acetate 150 MG/ML SUSY INJECT INTRAMUSCULARLY EVERY 3 MONTHS. 03/01/18   Merlyn Albert, MD  phenazopyridine  (PYRIDIUM) 200 MG tablet Take 1 tablet (200 mg total) by mouth 3 (three) times daily. 08/11/18   Devoria Albe, MD  sulfacetamide (BLEPH-10) 10 % ophthalmic solution 2 drops to affected eye qid for 3 -5 days 07/09/18   Babs Sciara, MD    Family History History reviewed. No pertinent family history.  Social History Social History   Tobacco Use  . Smoking status: Never Smoker  . Smokeless tobacco: Never Used  Substance Use Topics  . Alcohol use: No  . 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    Physical Exam Updated Vital Signs BP 122/65   Pulse 89   Temp 98.2 F (36.8 C) (Oral)   Ht 5\' 2"  (1.575 m)   Wt 53.1 kg   SpO2 99%   BMI 21.40 kg/m   Vital signs normal    Physical Exam  Constitutional: She is oriented to person, place, and time. She appears well-developed and well-nourished.  Non-toxic appearance. She does not appear ill. She appears distressed.  Patient is holding her right flank  HENT:  Head: Normocephalic and atraumatic.  Right Ear:  External ear normal.  Left Ear: External ear normal.  Nose: Nose normal. No mucosal edema or rhinorrhea.  Mouth/Throat: Oropharynx is clear and moist and mucous membranes are normal. No dental abscesses or uvula swelling.  Eyes: Pupils are equal, round, and reactive to light. Conjunctivae and EOM are normal.  Neck: Normal range of motion and full passive range of motion without pain. Neck supple.  Cardiovascular: Normal rate, regular rhythm and normal heart sounds. Exam reveals no gallop and no friction rub.  No murmur heard. Pulmonary/Chest: Effort normal and breath sounds normal. No respiratory distress. She has no wheezes. She has no rhonchi. She has no rales. She exhibits no tenderness and no crepitus.  Abdominal: Soft. Normal appearance and bowel sounds are  normal. She exhibits no distension. There is tenderness. There is no rebound and no guarding.    When patient shows me where she hurts she puts her hand over her umbilicus however when I examine her she is tender to palpation in the right lower quadrant and in the right flank.  Musculoskeletal: Normal range of motion. She exhibits no edema or tenderness.  Moves all extremities well.   Neurological: She is alert and oriented to person, place, and time. She has normal strength. No cranial nerve deficit.  Skin: Skin is warm, dry and intact. No rash noted. No erythema. No pallor.  Psychiatric: She has a normal mood and affect. Her speech is normal and behavior is normal. Her mood appears not anxious.  Nursing note and vitals reviewed.    ED Treatments / Results  Labs (all labs ordered are listed, but only abnormal results are displayed) Results for orders placed or performed during the hospital encounter of 08/11/18  Urinalysis, Routine w reflex microscopic  Result Value Ref Range   Color, Urine YELLOW YELLOW   APPearance CLOUDY (A) CLEAR   Specific Gravity, Urine 1.017 1.005 - 1.030   pH 6.0 5.0 - 8.0   Glucose, UA NEGATIVE NEGATIVE mg/dL   Hgb urine dipstick LARGE (A) NEGATIVE   Bilirubin Urine NEGATIVE NEGATIVE   Ketones, ur NEGATIVE NEGATIVE mg/dL   Protein, ur 960 (A) NEGATIVE mg/dL   Nitrite NEGATIVE NEGATIVE   Leukocytes, UA LARGE (A) NEGATIVE   RBC / HPF >50 (H) 0 - 5 RBC/hpf   WBC, UA >50 (H) 0 - 5 WBC/hpf   Bacteria, UA RARE (A) NONE SEEN   Squamous Epithelial / LPF 6-10 0 - 5   WBC Clumps PRESENT    Mucus PRESENT    Budding Yeast PRESENT    Uric Acid Crys, UA PRESENT    Non Squamous Epithelial 0-5 (A) NONE SEEN  CBC with Differential  Result Value Ref Range   WBC 17.6 (H) 4.0 - 10.5 K/uL   RBC 4.61 3.87 - 5.11 MIL/uL   Hemoglobin 14.0 12.0 - 15.0 g/dL   HCT 45.4 09.8 - 11.9 %   MCV 92.0 80.0 - 100.0 fL   MCH 30.4 26.0 - 34.0 pg   MCHC 33.0 30.0 - 36.0 g/dL   RDW  14.7 82.9 - 56.2 %   Platelets 281 150 - 400 K/uL   nRBC 0.0 0.0 - 0.2 %   Neutrophils Relative % 78 %   Neutro Abs 13.6 (H) 1.7 - 7.7 K/uL   Lymphocytes Relative 13 %   Lymphs Abs 2.3 0.7 - 4.0 K/uL   Monocytes Relative 7 %   Monocytes Absolute 1.2 (H) 0.1 - 1.0 K/uL   Eosinophils Relative 2 %  Eosinophils Absolute 0.4 0.0 - 0.5 K/uL   Basophils Relative 0 %   Basophils Absolute 0.0 0.0 - 0.1 K/uL   Immature Granulocytes 0 %   Abs Immature Granulocytes 0.06 0.00 - 0.07 K/uL  Basic metabolic panel  Result Value Ref Range   Sodium 139 135 - 145 mmol/L   Potassium 3.7 3.5 - 5.1 mmol/L   Chloride 106 98 - 111 mmol/L   CO2 24 22 - 32 mmol/L   Glucose, Bld 110 (H) 70 - 99 mg/dL   BUN 12 6 - 20 mg/dL   Creatinine, Ser 0.98 0.44 - 1.00 mg/dL   Calcium 9.1 8.9 - 11.9 mg/dL   GFR calc non Af Amer >60 >60 mL/min   GFR calc Af Amer >60 >60 mL/min   Anion gap 9 5 - 15  POC urine preg, ED  Result Value Ref Range   Preg Test, Ur NEGATIVE NEGATIVE   Laboratory interpretation all normal except leukocytosis, UTI, urine culture sent    EKG None  Radiology Ct Renal Stone Study  Result Date: 08/11/2018 CLINICAL DATA:  RIGHT flank pain since yesterday, worsening today. Dysuria. History of kidney stones with similar symptoms. EXAM: CT ABDOMEN AND PELVIS WITHOUT CONTRAST TECHNIQUE: Multidetector CT imaging of the abdomen and pelvis was performed following the standard protocol without IV contrast. COMPARISON:  CT abdomen and pelvis March 02, 2016 FINDINGS: LOWER CHEST: Lung bases are clear. The visualized heart size is normal. No pericardial effusion. HEPATOBILIARY: Normal. PANCREAS: Normal. SPLEEN: Normal. ADRENALS/URINARY TRACT: Kidneys are orthotopic, demonstrating normal size and morphology. No nephrolithiasis, hydronephrosis; limited assessment for renal masses by nonenhanced CT. The unopacified ureters are normal in course and caliber. Urinary bladder is decompressed with mild perivesicular  fat stranding. Normal adrenal glands. STOMACH/BOWEL: The stomach, small and large bowel are normal in course and caliber without inflammatory changes, sensitivity decreased by lack of enteric contrast. Moderate amount of retained large bowel stool. Small amount of small bowel feces seen with chronic stasis. Normal appendix. VASCULAR/LYMPHATIC: Aortoiliac vessels are normal in course and caliber. No lymphadenopathy by CT size criteria. REPRODUCTIVE: Normal. OTHER: No intraperitoneal free fluid or free air. MUSCULOSKELETAL: Non-acute. IMPRESSION: 1. Mild perivesicular fat stranding concerning for cystitis. No nephrolithiasis or obstructive uropathy. 2. Moderate amount of retained large bowel stool without bowel obstruction. Electronically Signed   By: Awilda Metro M.D.   On: 08/11/2018 06:14    Procedures Procedures (including critical care time)  Medications Ordered in ED Medications  ondansetron (ZOFRAN) injection 4 mg (4 mg Intravenous Given 08/11/18 0549)  ketorolac (TORADOL) 30 MG/ML injection 30 mg (30 mg Intravenous Given 08/11/18 0549)  cefTRIAXone (ROCEPHIN) 1 g in sodium chloride 0.9 % 100 mL IVPB ( Intravenous Stopped 08/11/18 0705)  phenazopyridine (PYRIDIUM) tablet 200 mg (200 mg Oral Given 08/11/18 1478)     Initial Impression / Assessment and Plan / ED Course  I have reviewed the triage vital signs and the nursing notes.  Pertinent labs & imaging results that were available during my care of the patient were reviewed by me and considered in my medical decision making (see chart for details).     Patient was given IV Toradol and nausea medication for her pain.  CT renal was ordered.  However we are doing a pregnancy test first.  6:12 AM I reviewed her urinalysis and ordered 1 g of Rocephin IV and a urine culture.  Shortly afterwards her CT resulted showing some inflammatory changes around the bladder but nothing else acute.  I went back to the room and talk to the patient and  her mother.  She does not appear as uncomfortable she did before.  Pyridium was also added to her regimen.    Final Clinical Impressions(s) / ED Diagnoses   Final diagnoses:  Acute cystitis with hematuria    ED Discharge Orders         Ordered    cephALEXin (KEFLEX) 500 MG capsule  3 times daily     08/11/18 0811    phenazopyridine (PYRIDIUM) 200 MG tablet  3 times daily     08/11/18 0811        OTC ibuprofen and acetaminophen  Plan discharge  Devoria AlbeIva Elba Dendinger, MD, [place]cha PyoFACEP    Pancho Rushing, MD 08/11/18 [phone]

## 2018-08-11 NOTE — ED Triage Notes (Signed)
Pt complains of right sided flank pain that started yesterday but has increased this morning . Pt says it burns when she urinates. Has a history of kidney stones and feels like that. Been roughly 2 years since the last one

## 2018-08-15 LAB — URINE CULTURE
Culture: >=100000 — AB
SPECIAL REQUESTS: NORMAL

## 2018-08-16 ENCOUNTER — Telehealth: Payer: Self-pay | Admitting: *Deleted

## 2018-08-16 NOTE — Telephone Encounter (Signed)
Post ED Visit - Positive Culture Follow-up  Culture report reviewed by antimicrobial stewardship pharmacist:  []  Enzo BiNathan Batchelder, Pharm.D. []  Celedonio MiyamotoJeremy Frens, Pharm.D., BCPS AQ-ID []  Garvin FilaMike Maccia, Pharm.D., BCPS []  Georgina PillionElizabeth Martin, Pharm.D., BCPS []  [place] HeightsMinh Pham, [address] Rainbow BoulevardPharm.D., BCPS, AAHIVP []  Estella HuskMichelle Turner, Pharm.D., BCPS, AAHIVP [x]  Lysle Pearlachel Rumbarger, PharmD, BCPS []  Phillips Climeshuy Dang, PharmD, BCPS []  Agapito GamesAlison Masters, PharmD, BCPS []  Verlan FriendsErin Deja, PharmD  Positive urine culture Treated with Cephalexin, organism sensitive to the same and no further patient follow-up is required at this time.  Virl AxeRobertson, Haidar Muse [facility] 08/16/2018, 11:32 AM

## 2018-08-18 ENCOUNTER — Ambulatory Visit (INDEPENDENT_AMBULATORY_CARE_PROVIDER_SITE_OTHER): Payer: Medicaid Other | Admitting: Family Medicine

## 2018-08-18 ENCOUNTER — Encounter: Payer: Self-pay | Admitting: Family Medicine

## 2018-08-18 VITALS — BP 110/80 | Ht 63.0 in | Wt 117.0 lb

## 2018-08-18 DIAGNOSIS — Z309 Encounter for contraceptive management, unspecified: Secondary | ICD-10-CM

## 2018-08-18 DIAGNOSIS — Z3009 Encounter for other general counseling and advice on contraception: Secondary | ICD-10-CM

## 2018-08-18 MED ORDER — LEVONORGESTREL-ETHINYL ESTRAD 0.1-20 MG-MCG PO TABS: 1.0000 | ORAL_TABLET | Freq: Every day | ORAL | 11 refills | Status: DC

## 2018-08-18 NOTE — Progress Notes (Signed)
   Subjective:    Patient ID: Colleen Howard, female    DOB: 02/17/1996, 22 y.o.   MRN: [id number]  HPI  Patient is here today to discuss resuming her birthcontrol.She states she had been on depo in the past,but now wants to start an oral bc. She last took the depo two June 07,2019 ago here. She missed her 05/2018 appt her and has been sexually active between that time.No other concerns.  Stopped depo because she forgot her last appt. Has not had menstrual cycle since her last depo shot in June 2019. Pt is interested in starting on the pill. Reports sexually active with 1 partner, reports consistent condom use.   Was seen in ED last week for UTI and treated, symptoms have resolved, urine pregnancy test then was negative. Denies hx of migraines with auras. She is a non-smoker.    Review of Systems  Genitourinary: Negative for difficulty urinating, dysuria, menstrual problem, pelvic pain, vaginal bleeding and vaginal discharge.       Objective:   Physical Exam  Constitutional: She is oriented to person, place, and time. She appears well-developed and well-nourished. No distress.  HENT:  Head: Normocephalic and atraumatic.  Neck: Neck supple.  Cardiovascular: Normal rate, regular rhythm and normal heart sounds.  No murmur heard. Pulmonary/Chest: Effort normal and breath sounds normal. No respiratory distress.  Neurological: She is alert and oriented to person, place, and time.  Skin: Skin is warm and dry.  Psychiatric: She has a normal mood and affect.  Nursing note and vitals reviewed.      Assessment & Plan:  Encounter for counseling regarding contraception  Will start on birth control pill. Educated on proper use. Discussed risks and benefits. Urine pregnancy x 1 week ago negative and pt reports consistent condom use. Recommend scheduling wellness exam with pap smear.   Dr. Lubertha SouthSteve Luking was consulted on this case and is in agreement with the above treatment plan.

## 2018-08-18 NOTE — Patient Instructions (Signed)
Oral Contraception Information Oral contraceptive pills (OCPs) are medicines taken to prevent pregnancy. OCPs work by preventing the ovaries from releasing eggs. The hormones in OCPs also cause the cervical mucus to thicken, preventing the sperm from entering the uterus. The hormones also cause the uterine lining to become thin, not allowing a fertilized egg to attach to the inside of the uterus. OCPs are highly effective when taken exactly as prescribed. However, OCPs do not prevent sexually transmitted diseases (STDs). Safe sex practices, such as using condoms along with the pill, can help prevent STDs. Before taking the pill, you may have a physical exam and Pap test. Your health care provider may order blood tests. The health care provider will make sure you are a good candidate for oral contraception. Discuss with your health care provider the possible side effects of the OCP you may be prescribed. When starting an OCP, it can take 2 to 3 months for the body to adjust to the changes in hormone levels in your body. Types of oral contraception  The combination pill-This pill contains estrogen and progestin (synthetic progesterone) hormones. The combination pill comes in 21-day, 28-day, or 91-day packs. Some types of combination pills are meant to be taken continuously (365-day pills). With 21-day packs, you do not take pills for 7 days after the last pill. With 28-day packs, the pill is taken every day. The last 7 pills are without hormones. Certain types of pills have more than 21 hormone-containing pills. With 91-day packs, the first 84 pills contain both hormones, and the last 7 pills contain no hormones or contain estrogen only.  The minipill-This pill contains the progesterone hormone only. The pill is taken every day continuously. It is very important to take the pill at the same time each day. The minipill comes in packs of 28 pills. All 28 pills contain the hormone. Advantages of oral  contraceptive pills  Decreases premenstrual symptoms.  Treats menstrual period cramps.  Regulates the menstrual cycle.  Decreases a heavy menstrual flow.  May treatacne, depending on the type of pill.  Treats abnormal uterine bleeding.  Treats polycystic ovarian syndrome.  Treats endometriosis.  Can be used as emergency contraception. Things that can make oral contraceptive pills less effective OCPs can be less effective if:  You forget to take the pill at the same time every day.  You have a stomach or intestinal disease that lessens the absorption of the pill.  You take OCPs with other medicines that make OCPs less effective, such as antibiotics, certain HIV medicines, and some seizure medicines.  You take expired OCPs.  You forget to restart the pill on day 7, when using the packs of 21 pills.  Risks associated with oral contraceptive pills Oral contraceptive pills can sometimes cause side effects, such as:  Headache.  Nausea.  Breast tenderness.  Irregular bleeding or spotting.  Combination pills are also associated with a small increased risk of:  Blood clots.  Heart attack.  Stroke.  This information is not intended to replace advice given to you by your health care provider. Make sure you discuss any questions you have with your health care provider. Document Released: 12/06/2002 Document Revised: 02/21/2016 Document Reviewed: 03/06/2013 Elsevier Interactive Patient Education  2018 Elsevier Inc.  

## 2018-08-28 ENCOUNTER — Encounter (HOSPITAL_COMMUNITY): Payer: Self-pay | Admitting: Emergency Medicine

## 2018-08-28 ENCOUNTER — Emergency Department (HOSPITAL_COMMUNITY): Payer: Self-pay

## 2018-08-28 ENCOUNTER — Other Ambulatory Visit: Payer: Self-pay

## 2018-08-28 ENCOUNTER — Emergency Department (HOSPITAL_COMMUNITY)
Admission: EM | Admit: 2018-08-28 | Discharge: 2018-08-28 | Disposition: A | Discharge status: Home / Self Care | Payer: Self-pay | Attending: Emergency Medicine | Admitting: Emergency Medicine

## 2018-08-28 DIAGNOSIS — Y939 Activity, unspecified: Secondary | ICD-10-CM | POA: Insufficient documentation

## 2018-08-28 DIAGNOSIS — Y929 Unspecified place or not applicable: Secondary | ICD-10-CM | POA: Insufficient documentation

## 2018-08-28 DIAGNOSIS — S93401A Sprain of unspecified ligament of right ankle, initial encounter: Secondary | ICD-10-CM | POA: Insufficient documentation

## 2018-08-28 DIAGNOSIS — W108XXA Fall (on) (from) other stairs and steps, initial encounter: Secondary | ICD-10-CM | POA: Insufficient documentation

## 2018-08-28 DIAGNOSIS — Y999 Unspecified external cause status: Secondary | ICD-10-CM | POA: Insufficient documentation

## 2018-08-28 MED ORDER — IBUPROFEN 600 MG PO TABS: 600.0000 mg | ORAL_TABLET | Freq: Four times a day (QID) | ORAL | 0 refills | Status: DC | PRN

## 2018-08-28 MED ORDER — ACETAMINOPHEN 500 MG PO TABS
1000.0000 mg | ORAL_TABLET | Freq: Once | ORAL | Status: AC
  Administered 2018-08-28: 1000 mg via ORAL
  Filled 2018-08-28: qty 2

## 2018-08-28 NOTE — ED Provider Notes (Signed)
[facility] EMERGENCY DEPARTMENT Provider Note   CSN: [id number] Arrival date & time: 08/28/18  1737     History   Chief Complaint Chief Complaint  Patient presents with  . Ankle Pain    HPI Colleen Howard is a 22 y.o. female.  HPI Patient states she tripped and fell down several stairs twisting her right ankle.  She denies any head or neck injury.  States she is had swelling and pain to the ankle.  Difficulty weightbearing due to pain. Past Medical History:  Diagnosis Date  . Kidney stone   . Migraine headache     Patient Active Problem List   Diagnosis Date Noted  . Migraines 07/07/2013    Past Surgical History:  Procedure Laterality Date  . tubes in ear       OB History   None      Home Medications    Prior to Admission medications   Medication Sig Start Date End Date Taking? Authorizing Provider  cephALEXin (KEFLEX) 500 MG capsule Take 1 capsule (500 mg total) by mouth 3 (three) times daily. 08/11/18   Devoria Albe, MD  ibuprofen (ADVIL,MOTRIN) 600 MG tablet Take 1 tablet (600 mg total) by mouth every 6 (six) hours as needed. 08/28/18   Loren Racer, MD  levonorgestrel-ethinyl estradiol (AVIANE) 0.1-20 MG-MCG tablet Take 1 tablet by mouth daily. 08/18/18   Jeannine Boga, NP    Family History History reviewed. No pertinent family history.  Social History Social History   Tobacco Use  . Smoking status: Never Smoker  . Smokeless tobacco: Never Used  Substance Use Topics  . Alcohol use: No  . Drug use: No     Allergies   Bactrim [sulfamethoxazole-trimethoprim]; Codeine; and Hydrocodone   Review of Systems Review of Systems  Constitutional: Negative for chills and fever.  Respiratory: Negative for shortness of breath.   Cardiovascular: Negative for chest pain.  Gastrointestinal: Negative for abdominal pain.  Musculoskeletal: Positive for arthralgias. Negative for Howard pain, myalgias and neck pain.  Skin: Negative for rash and wound.   Neurological: Negative for dizziness, syncope, weakness, light-headedness, numbness and headaches.  All other systems reviewed and are negative.    Physical Exam Updated Vital Signs BP 109/79 (BP Location: Right Arm)   Pulse 76   Temp 98.3 F (36.8 C)   Resp 16   Ht 5\' 2"  (1.575 m)   Wt 53.1 kg   SpO2 100%   BMI 21.40 kg/m   Physical Exam  Constitutional: She is oriented to person, place, and time. She appears well-developed and well-nourished. No distress.  HENT:  Head: Normocephalic and atraumatic.  Mouth/Throat: No oropharyngeal exudate.  Eyes: Pupils are equal, round, and reactive to light. EOM are normal.  Neck: Normal range of motion. Neck supple.  No posterior midline cervical tenderness to palpation.  Cardiovascular: Normal rate and regular rhythm.  Pulmonary/Chest: Effort normal and breath sounds normal.  Abdominal: Soft. Bowel sounds are normal. There is no tenderness. There is no rebound and no guarding.  Musculoskeletal: Normal range of motion. She exhibits tenderness. She exhibits no edema.  Patient has tenderness to palpation just distal to the right lateral malleolus.  No proximal fibular tenderness.  No obvious swelling.  Distal pulses intact.  Full range of motion of the right knee and hip.  Neurological: She is alert and oriented to person, place, and time.  Skin: Skin is warm and dry. Capillary refill takes less than 2 seconds. No rash noted. She is not  diaphoretic. No erythema.  Psychiatric: She has a normal mood and affect. Her behavior is normal.  Nursing note and vitals reviewed.    ED Treatments / Results  Labs (all labs ordered are listed, but only abnormal results are displayed) Labs Reviewed - No data to display  EKG None  Radiology Dg Ankle Complete Right  Result Date: 08/28/2018 CLINICAL DATA:  RIGHT ankle injury.  Fall today. EXAM: RIGHT ANKLE - COMPLETE 3+ VIEW COMPARISON:  None. FINDINGS: No fracture deformity nor dislocation. The  ankle mortise appears congruent and the tibiofibular syndesmosis intact. No destructive bony lesions. Soft tissue planes are non-suspicious. IMPRESSION: Negative. Electronically Signed   By: Awilda Metroourtnay  Bloomer M.D.   On: 08/28/2018 18:41    Procedures Procedures (including critical care time)  Medications Ordered in ED Medications  acetaminophen (TYLENOL) tablet 1,000 mg (1,000 mg Oral Given 08/28/18 1937)     Initial Impression / Assessment and Plan / ED Course  I have reviewed the triage vital signs and the nursing notes.  Pertinent labs & imaging results that were available during my care of the patient were reviewed by me and considered in my medical decision making (see chart for details).     X-ray without acute findings.  Suspect ankle sprain.  Will place an Ace wrap and give crutches as needed for ambulation.  Have encouraged RICE therapy and return precautions given.  Final Clinical Impressions(s) / ED Diagnoses   Final diagnoses:  Sprain of right ankle, unspecified ligament, initial encounter    ED Discharge Orders         Ordered    ibuprofen (ADVIL,MOTRIN) 600 MG tablet  Every 6 hours PRN     08/28/18 1938           Loren RacerYelverton, Sharvil Hoey, MD 08/28/18 [phone]

## 2018-08-28 NOTE — ED Notes (Signed)
Reports fell down four stairs that were wet  Twisted ankle   Denies hitting head or other injury

## 2018-08-28 NOTE — ED Triage Notes (Signed)
Patient c/o right ankle/foot pain after falling today down stairs and twisting it. Denies hitting head or LOC.

## 2018-09-02 ENCOUNTER — Ambulatory Visit (INDEPENDENT_AMBULATORY_CARE_PROVIDER_SITE_OTHER): Payer: Medicaid Other | Admitting: Orthopaedic Surgery

## 2018-09-07 ENCOUNTER — Encounter: Payer: Medicaid Other | Admitting: Family Medicine

## 2018-09-15 ENCOUNTER — Encounter: Payer: Self-pay | Admitting: Family Medicine

## 2018-09-15 ENCOUNTER — Ambulatory Visit (INDEPENDENT_AMBULATORY_CARE_PROVIDER_SITE_OTHER): Payer: Medicaid Other | Admitting: Family Medicine

## 2018-09-15 VITALS — BP 120/82 | Ht 63.0 in | Wt 120.0 lb

## 2018-09-15 DIAGNOSIS — Z0000 Encounter for general adult medical examination without abnormal findings: Secondary | ICD-10-CM

## 2018-09-15 DIAGNOSIS — Z131 Encounter for screening for diabetes mellitus: Secondary | ICD-10-CM

## 2018-09-15 DIAGNOSIS — Z114 Encounter for screening for human immunodeficiency virus [HIV]: Secondary | ICD-10-CM

## 2018-09-15 DIAGNOSIS — Z124 Encounter for screening for malignant neoplasm of cervix: Secondary | ICD-10-CM | POA: Diagnosis not present

## 2018-09-15 DIAGNOSIS — Z862 Personal history of diseases of the blood and blood-forming organs and certain disorders involving the immune mechanism: Secondary | ICD-10-CM

## 2018-09-15 DIAGNOSIS — Z79899 Other long term (current) drug therapy: Secondary | ICD-10-CM

## 2018-09-15 DIAGNOSIS — Z13220 Encounter for screening for lipoid disorders: Secondary | ICD-10-CM

## 2018-09-15 NOTE — Patient Instructions (Signed)
Preventive Care 18-39 Years, Female Preventive care refers to lifestyle choices and visits with your health care provider that can promote health and wellness. What does preventive care include?   A yearly physical exam. This is also called an annual well check.  Dental exams once or twice a year.  Routine eye exams. Ask your health care provider how often you should have your eyes checked.  Personal lifestyle choices, including: ? Daily care of your teeth and gums. ? Regular physical activity. ? Eating a healthy diet. ? Avoiding tobacco and drug use. ? Limiting alcohol use. ? Practicing safe sex. ? Taking vitamin and mineral supplements as recommended by your health care provider. What happens during an annual well check? The services and screenings done by your health care provider during your annual well check will depend on your age, overall health, lifestyle risk factors, and family history of disease. Counseling Your health care provider may ask you questions about your:  Alcohol use.  Tobacco use.  Drug use.  Emotional well-being.  Home and relationship well-being.  Sexual activity.  Eating habits.  Work and work Statistician.  Method of birth control.  Menstrual cycle.  Pregnancy history. Screening You may have the following tests or measurements:  Height, weight, and BMI.  Diabetes screening. This is done by checking your blood sugar (glucose) after you have not eaten for a while (fasting).  Blood pressure.  Lipid and cholesterol levels. These may be checked every 5 years starting at age 87.  Skin check.  Hepatitis C blood test.  Hepatitis B blood test.  Sexually transmitted disease (STD) testing.  BRCA-related cancer screening. This may be done if you have a family history of breast, ovarian, tubal, or peritoneal cancers.  Pelvic exam and Pap test. This may be done every 3 years starting at age 61. Starting at age 64, this may be done every 5  years if you have a Pap test in combination with an HPV test. Discuss your test results, treatment options, and if necessary, the need for more tests with your health care provider. Vaccines Your health care provider may recommend certain vaccines, such as:  Influenza vaccine. This is recommended every year.  Tetanus, diphtheria, and acellular pertussis (Tdap, Td) vaccine. You may need a Td booster every 10 years.  Varicella vaccine. You may need this if you have not been vaccinated.  HPV vaccine. If you are 13 or younger, you may need three doses over 6 months.  Measles, mumps, and rubella (MMR) vaccine. You may need at least one dose of MMR. You may also need a second dose.  Pneumococcal 13-valent conjugate (PCV13) vaccine. You may need this if you have certain conditions and were not previously vaccinated.  Pneumococcal polysaccharide (PPSV23) vaccine. You may need one or two doses if you smoke cigarettes or if you have certain conditions.  Meningococcal vaccine. One dose is recommended if you are age 105-21 years and a first-year college student living in a residence hall, or if you have one of several medical conditions. You may also need additional booster doses.  Hepatitis A vaccine. You may need this if you have certain conditions or if you travel or work in places where you may be exposed to hepatitis A.  Hepatitis B vaccine. You may need this if you have certain conditions or if you travel or work in places where you may be exposed to hepatitis B.  Haemophilus influenzae type b (Hib) vaccine. You may need this if you  have certain risk factors. Talk to your health care provider about which screenings and vaccines you need and how often you need them. This information is not intended to replace advice given to you by your health care provider. Make sure you discuss any questions you have with your health care provider. Document Released: 11/11/2001 Document Revised: 04/28/2017  Document Reviewed: 07/17/2015 Elsevier Interactive Patient Education  2019 [employer].

## 2018-09-15 NOTE — Progress Notes (Signed)
Subjective:    Patient ID: Colleen Howard, female    DOB: 12/01/1995, 22 y.o.   MRN: [id number]  HPI The patient comes in today for a wellness visit.  A review of their health history was completed. A review of medications was also completed.  Any needed refills; yes on b/c  Eating habits: Good  Falls/  MVA accidents in past few months: No  Regular exercise: Yes, works out at gym 2-3 times per week.   Specialist pt sees on regular basis: No  Preventative health issues were discussed. Gets regular vision and dental exams. Pap smear done today.  Additional concerns: None.  Her sister is going to sit in the room with her for her pap.  Has been on birth control pill x 1 month. Sexually active, 1 partner, consistent condom use. Denies problems.   Review of Systems  Constitutional: Negative for chills, fatigue, fever and unexpected weight change.  HENT: Negative for congestion, ear pain, sinus pressure, sinus pain and sore throat.   Eyes: Negative for discharge and visual disturbance.  Respiratory: Negative for cough, shortness of breath and wheezing.   Cardiovascular: Negative for chest pain and leg swelling.  Gastrointestinal: Negative for abdominal pain, blood in stool, constipation, diarrhea, nausea and vomiting.  Genitourinary: Negative for difficulty urinating, hematuria, pelvic pain, vaginal bleeding and vaginal discharge.  Neurological: Negative for dizziness, weakness, light-headedness and headaches.  Psychiatric/Behavioral: Negative for suicidal ideas.  All other systems reviewed and are negative.      Objective:   Physical Exam Vitals signs and nursing note reviewed. Exam conducted with a chaperone present.  Constitutional:      General: She is not in acute distress.    Appearance: Normal appearance. She is well-developed and normal weight.  HENT:     Head: Normocephalic and atraumatic.     Right Ear: Tympanic membrane normal.     Left Ear: Tympanic membrane  normal.     Nose: Nose normal.     Mouth/Throat:     Mouth: Mucous membranes are moist.     Pharynx: Oropharynx is clear. Uvula midline.  Eyes:     General:        Right eye: No discharge.        Left eye: No discharge.     Extraocular Movements: Extraocular movements intact.     Conjunctiva/sclera: Conjunctivae normal.     Pupils: Pupils are equal, round, and reactive to light.  Neck:     Musculoskeletal: Neck supple.     Thyroid: No thyromegaly.  Cardiovascular:     Rate and Rhythm: Normal rate and regular rhythm.     Heart sounds: Normal heart sounds. No murmur.  Pulmonary:     Effort: Pulmonary effort is normal. No respiratory distress.     Breath sounds: Normal breath sounds. No wheezing.  Chest:     Breasts: Breasts are symmetrical.        Right: Normal. No inverted nipple, mass, nipple discharge, skin change or tenderness.        Left: Normal. No inverted nipple, mass, nipple discharge, skin change or tenderness.     Comments: Bilateral nipple piercings, appear well healed, no sign of infection Abdominal:     General: Bowel sounds are normal. There is no distension.     Palpations: Abdomen is soft. There is no mass.     Tenderness: There is no abdominal tenderness.  Genitourinary:    General: Normal vulva.     Labia:  Right: No rash, tenderness or lesion.        Left: No rash, tenderness or lesion.      Vagina: Normal.     Cervix: Discharge present. No cervical motion tenderness.     Uterus: Normal.      Adnexa: Right adnexa normal and left adnexa normal.       Right: No mass or tenderness.         Left: No mass or tenderness.    Musculoskeletal:        General: No deformity.     Right lower leg: No edema.     Left lower leg: No edema.  Lymphadenopathy:     Cervical: No cervical adenopathy.     Upper Body:     Right upper body: No supraclavicular or axillary adenopathy.     Left upper body: No supraclavicular or axillary adenopathy.  Skin:    General:  Skin is warm and dry.  Neurological:     Mental Status: She is alert and oriented to person, place, and time.     Coordination: Coordination normal.  Psychiatric:        Mood and Affect: Mood normal.       Assessment & Plan:  1. Annual physical exam Adult wellness-complete.wellness physical was conducted today. Importance of diet and exercise were discussed in detail.  In addition to this a discussion regarding safety was also covered. We also reviewed over immunizations and gave recommendations regarding current immunization needed for age.  In addition to this additional areas were also touched on including: Preventative health exams needed:  Colonoscopy N/A Pap smear done today  Patient was advised yearly wellness exam. Will obtain some screening lab work.   2. Screening for cervical cancer - Plan: Pap IG, CT/NG w/ reflex HPV when ASC-U  Will also screen for GC/Chlamydia added to pap smear.  3. Screening for lipid disorders - Plan: Lipid panel  4. Screening for diabetes mellitus - Plan: Glucose, random  5. History of leukocytosis - Plan: CBC with Differential/Platelet Elevated WBC on previous CBC 1 month ago when seen in ED for UTI. Will recheck to ensure has returned to normal.  6. Screening for HIV (human immunodeficiency virus) - Plan: HIV Antibody (routine testing w rflx) Screening for HIV per CDC guidelines.  High risk medication use - Plan: Hepatic function panel   Dr. Lubertha South was consulted on this case and is in agreement with the above treatment plan.

## 2018-09-16 LAB — HEPATIC FUNCTION PANEL
ALT: 9 [IU]/L (ref 0–32)
AST: 17 [IU]/L (ref 0–40)
Albumin: 4.7 g/dL (ref 3.5–5.5)
Alkaline Phosphatase: 67 [IU]/L (ref 39–117)
Bilirubin Total: 0.3 mg/dL (ref 0.0–1.2)
Bilirubin, Direct: 0.1 mg/dL (ref 0.00–0.40)
Total Protein: 7 g/dL (ref 6.0–8.5)

## 2018-09-16 LAB — CBC WITH DIFFERENTIAL/PLATELET
Basophils Absolute: 0.1 10*3/uL (ref 0.0–0.2)
Basos: 1 %
EOS (ABSOLUTE): 0.3 10*3/uL (ref 0.0–0.4)
Eos: 4 %
Hematocrit: 43.8 % (ref 34.0–46.6)
Hemoglobin: 14.3 g/dL (ref 11.1–15.9)
Immature Grans (Abs): 0 10*3/uL (ref 0.0–0.1)
Immature Granulocytes: 0 %
Lymphocytes Absolute: 2.2 10*3/uL (ref 0.7–3.1)
Lymphs: 29 %
MCH: 30.4 pg (ref 26.6–33.0)
MCHC: 32.6 g/dL (ref 31.5–35.7)
MCV: 93 fL (ref 79–97)
Monocytes Absolute: 0.6 10*3/uL (ref 0.1–0.9)
Monocytes: 8 %
Neutrophils Absolute: 4.5 10*3/uL (ref 1.4–7.0)
Neutrophils: 58 %
Platelets: 282 10*3/uL (ref 150–450)
RBC: 4.71 x10E6/uL (ref 3.77–5.28)
RDW: 12.1 % — ABNORMAL LOW (ref 12.3–15.4)
WBC: 7.6 10*3/uL (ref 3.4–10.8)

## 2018-09-16 LAB — LIPID PANEL
Chol/HDL Ratio: 2.3 ratio (ref 0.0–4.4)
Cholesterol, Total: 142 mg/dL (ref 100–199)
HDL: 62 mg/dL (ref >39)
LDL Calculated: 69 mg/dL (ref 0–99)
Triglycerides: 53 mg/dL (ref 0–149)
VLDL CHOLESTEROL CAL: 11 mg/dL (ref 5–40)

## 2018-09-16 LAB — HIV ANTIBODY (ROUTINE TESTING W REFLEX): HIV SCREEN 4TH GENERATION: NONREACTIVE

## 2018-09-16 LAB — GLUCOSE, RANDOM: Glucose: 81 mg/dL (ref 65–99)

## 2018-09-20 LAB — PAP IG, CT-NG, RFX HPV ASCU
Chlamydia, Nuc. Acid Amp: POSITIVE — AB
Gonococcus by Nucleic Acid Amp: NEGATIVE

## 2018-09-21 MED ORDER — AZITHROMYCIN 500 MG PO TABS: 1000.0000 mg | ORAL_TABLET | Freq: Once | ORAL | 0 refills | Status: AC

## 2018-09-21 NOTE — Addendum Note (Signed)
Addended by: Margaretha SheffieldBROWN, Juda Toepfer S on: 09/21/2018 11:48 AM   Modules accepted: Orders

## 2018-12-23 ENCOUNTER — Telehealth: Payer: Self-pay | Admitting: Family Medicine

## 2018-12-23 NOTE — Telephone Encounter (Signed)
Contacted patient mother. Mother verbalized understanding.

## 2018-12-23 NOTE — Telephone Encounter (Signed)
Mom contacted office due to patient having a head cold. Headache and head congestion. No fever, no trouble breathing, no travel, no shortness of breath. Has been using Mucinex DM. Has been going on for about a day or so. Please advise. Thank you

## 2018-12-23 NOTE — Telephone Encounter (Signed)
With this going on for short span of time more than likely this is a virus or possibly even allergies I would recommend may use saline nasal spray I would also recommend may use allergy medicines as needed If progressive troubles such as sinus pressure pain or discomfort we can always do a tele-visit

## 2018-12-26 ENCOUNTER — Emergency Department (HOSPITAL_COMMUNITY): Payer: Self-pay

## 2018-12-26 ENCOUNTER — Other Ambulatory Visit: Payer: Self-pay

## 2018-12-26 ENCOUNTER — Emergency Department (HOSPITAL_COMMUNITY)
Admission: EM | Admit: 2018-12-26 | Discharge: 2018-12-26 | Disposition: A | Discharge status: Home / Self Care | Payer: Self-pay | Attending: Emergency Medicine | Admitting: Emergency Medicine

## 2018-12-26 ENCOUNTER — Encounter (HOSPITAL_COMMUNITY): Payer: Self-pay | Admitting: Emergency Medicine

## 2018-12-26 DIAGNOSIS — R109 Unspecified abdominal pain: Secondary | ICD-10-CM | POA: Insufficient documentation

## 2018-12-26 DIAGNOSIS — Z79899 Other long term (current) drug therapy: Secondary | ICD-10-CM | POA: Insufficient documentation

## 2018-12-26 LAB — COMPREHENSIVE METABOLIC PANEL
ALBUMIN: 3.9 g/dL (ref 3.5–5.0)
ALT: 24 U/L (ref 0–44)
AST: 21 U/L (ref 15–41)
Alkaline Phosphatase: 65 U/L (ref 38–126)
Anion gap: 8 (ref 5–15)
BUN: 11 mg/dL (ref 6–20)
CO2: 26 mmol/L (ref 22–32)
Calcium: 9.5 mg/dL (ref 8.9–10.3)
Chloride: 103 mmol/L (ref 98–111)
Creatinine, Ser: 0.62 mg/dL (ref 0.44–1.00)
GFR calc non Af Amer: >60 mL/min (ref >60)
GLUCOSE: 85 mg/dL (ref 70–99)
Potassium: 3.8 mmol/L (ref 3.5–5.1)
SODIUM: 137 mmol/L (ref 135–145)
Total Bilirubin: 0.5 mg/dL (ref 0.3–1.2)
Total Protein: 7.3 g/dL (ref 6.5–8.1)

## 2018-12-26 LAB — URINALYSIS, ROUTINE W REFLEX MICROSCOPIC
BILIRUBIN URINE: NEGATIVE
Glucose, UA: NEGATIVE mg/dL
Hgb urine dipstick: NEGATIVE
KETONES UR: NEGATIVE mg/dL
Nitrite: NEGATIVE
PROTEIN: NEGATIVE mg/dL
Specific Gravity, Urine: 1.016 (ref 1.005–1.030)
pH: 6 (ref 5.0–8.0)

## 2018-12-26 LAB — CBC
HCT: 43 % (ref 36.0–46.0)
Hemoglobin: 14.1 g/dL (ref 12.0–15.0)
MCH: 30.3 pg (ref 26.0–34.0)
MCHC: 32.8 g/dL (ref 30.0–36.0)
MCV: 92.3 fL (ref 80.0–100.0)
NRBC: 0 % (ref 0.0–0.2)
Platelets: 292 10*3/uL (ref 150–400)
RBC: 4.66 MIL/uL (ref 3.87–5.11)
RDW: 12.5 % (ref 11.5–15.5)
WBC: 8.1 10*3/uL (ref 4.0–10.5)

## 2018-12-26 LAB — LIPASE, BLOOD: Lipase: 29 U/L (ref 11–51)

## 2018-12-26 LAB — POC URINE PREG, ED: PREG TEST UR: NEGATIVE

## 2018-12-26 MED ORDER — SODIUM CHLORIDE 0.9% FLUSH
3.0000 mL | Freq: Once | INTRAVENOUS | Status: AC
  Administered 2018-12-26: 3 mL via INTRAVENOUS

## 2018-12-26 MED ORDER — ONDANSETRON HCL 4 MG/2ML IJ SOLN
4.0000 mg | Freq: Once | INTRAMUSCULAR | Status: AC
  Administered 2018-12-26: 4 mg via INTRAVENOUS
  Filled 2018-12-26: qty 2

## 2018-12-26 MED ORDER — SODIUM CHLORIDE 0.9 % IV BOLUS
1000.0000 mL | Freq: Once | INTRAVENOUS | Status: AC
  Administered 2018-12-26: 1000 mL via INTRAVENOUS

## 2018-12-26 MED ORDER — FENTANYL CITRATE (PF) 100 MCG/2ML IJ SOLN
50.0000 ug | Freq: Once | INTRAMUSCULAR | Status: AC
  Administered 2018-12-26: 50 ug via INTRAVENOUS
  Filled 2018-12-26: qty 2

## 2018-12-26 MED ORDER — IOHEXOL 300 MG/ML  SOLN
100.0000 mL | Freq: Once | INTRAMUSCULAR | Status: AC | PRN
  Administered 2018-12-26: 100 mL via INTRAVENOUS

## 2018-12-26 NOTE — ED Provider Notes (Signed)
[facility] EMERGENCY DEPARTMENT Provider Note   CSN: [id number] Arrival date & time: 12/26/18  1500    History   Chief Complaint Chief Complaint  Patient presents with  . Abdominal Pain    HPI Colleen Howard is a 23 y.o. female.     Right lower quadrant pain since Friday night.  Pain radiates to the back.  Patient is on birth control pills.  No dysuria, hematuria, fever, sweats, chills, nausea, vomiting, diarrhea, vaginal bleeding, vaginal discharge.  Past medical history kidney stones and migraine headache.  Severity is moderate.  Palpation makes pain worse.     Past Medical History:  Diagnosis Date  . Kidney stone   . Migraine headache     Patient Active Problem List   Diagnosis Date Noted  . Migraines 07/07/2013    Past Surgical History:  Procedure Laterality Date  . tubes in ear       OB History    Gravida  0   Para  0   Term  0   Preterm  0   AB  0   Living  0     SAB  0   TAB  0   Ectopic  0   Multiple  0   Live Births  0            Home Medications    Prior to Admission medications   Medication Sig Start Date End Date Taking? Authorizing Provider  acetaminophen (TYLENOL) 500 MG tablet Take 500 mg by mouth every 6 (six) hours as needed for mild pain or moderate pain.   Yes [provider]  ibuprofen (ADVIL,MOTRIN) 200 MG tablet Take 200 mg by mouth every 6 (six) hours as needed for mild pain or moderate pain.   Yes [provider]  levonorgestrel-ethinyl estradiol (AVIANE) 0.1-20 MG-MCG tablet Take 1 tablet by mouth daily. 08/18/18  Yes Jeannine Boga, NP    Family History No family history on file.  Social History Social History   Tobacco Use  . Smoking status: Never Smoker  . Smokeless tobacco: Never Used  Substance Use Topics  . Alcohol use: No  . Drug use: No     Allergies   Bactrim [sulfamethoxazole-trimethoprim]; Codeine; and Hydrocodone   Review of Systems Review of Systems  All  other systems reviewed and are negative.    Physical Exam Updated Vital Signs BP 117/78 (BP Location: Left Arm)   Pulse 85   Temp (!) 97.5 F (36.4 C) (Oral)   Resp 16   Ht 5\' 2"  (1.575 m)   Wt 57.2 kg   LMP 12/12/2018   SpO2 100%   BMI 23.05 kg/m   Physical Exam Vitals signs and nursing note reviewed.  Constitutional:      Appearance: She is well-developed.  HENT:     Head: Normocephalic and atraumatic.  Eyes:     Conjunctiva/sclera: Conjunctivae normal.  Neck:     Musculoskeletal: Neck supple.  Cardiovascular:     Rate and Rhythm: Normal rate and regular rhythm.  Pulmonary:     Effort: Pulmonary effort is normal.     Breath sounds: Normal breath sounds.  Abdominal:     General: Bowel sounds are normal.     Palpations: Abdomen is soft.     Comments: Tender right lower quadrant  Musculoskeletal: Normal range of motion.  Skin:    General: Skin is warm and dry.  Neurological:     Mental Status: She is alert and oriented  to person, place, and time.  Psychiatric:        Behavior: Behavior normal.      ED Treatments / Results  Labs (all labs ordered are listed, but only abnormal results are displayed) Labs Reviewed  LIPASE, BLOOD  COMPREHENSIVE METABOLIC PANEL  CBC  URINALYSIS, ROUTINE W REFLEX MICROSCOPIC  POC URINE PREG, ED    EKG None  Radiology No results found.  Procedures Procedures (including critical care time)  Medications Ordered in ED Medications  sodium chloride flush (NS) 0.9 % injection 3 mL (has no administration in time range)  sodium chloride 0.9 % bolus 1,000 mL (has no administration in time range)  fentaNYL (SUBLIMAZE) injection 50 mcg (has no administration in time range)     Initial Impression / Assessment and Plan / ED Course  I have reviewed the triage vital signs and the nursing notes.  Pertinent labs & imaging results that were available during my care of the patient were reviewed by me and considered in my medical  decision making (see chart for details).        Will obtain CT abdomen pelvis to rule out appendicitis.  Final Clinical Impressions(s) / ED Diagnoses   Final diagnoses:  None    ED Discharge Orders    None       Donnetta Hutching, MD 12/26/18 1534

## 2018-12-26 NOTE — Discharge Instructions (Addendum)
Tests were good.  No evidence of appendicitis on your CT scan.  Clear liquids.  Tylenol or ibuprofen for pain.

## 2018-12-26 NOTE — ED Triage Notes (Signed)
Patient c/o intermittent right lower abd pain that radiates to umbilical region x2 days. Per patient nausea, vomiting, and diarrhea. Denies any blood in emesis or stool. Denies any fevers or urinary symptoms.

## 2018-12-26 NOTE — ED Notes (Signed)
C/o nausea 

## 2018-12-27 ENCOUNTER — Telehealth: Payer: Self-pay | Admitting: Family Medicine

## 2018-12-27 ENCOUNTER — Ambulatory Visit (INDEPENDENT_AMBULATORY_CARE_PROVIDER_SITE_OTHER): Payer: Self-pay | Admitting: Family Medicine

## 2018-12-27 DIAGNOSIS — R1031 Right lower quadrant pain: Secondary | ICD-10-CM

## 2018-12-27 NOTE — Telephone Encounter (Signed)
Patient seen in ER yesterday for Right lower quadrant pain since Friday night.  Pain radiates to the back.  with nausea and vomiting- CT was negative but patient still having symptoms- Please advise

## 2018-12-27 NOTE — Telephone Encounter (Signed)
Discussed all with pt. Pt verbalized understanding and did not have any questions. She will call back if not better within the next week or sooner if worse. Added to schedule.

## 2018-12-27 NOTE — Telephone Encounter (Signed)
Tell pt I reviewed all at length , scan neg for appendicitis, b w all good, the hi likelihood is pt had a rupture cyst on an ovary causes this kind of pain, take two advil three times per day or two aleave twice per d will likely fade over coming wek. Definitely phone charge put on sched after disc with pt

## 2018-12-27 NOTE — Telephone Encounter (Signed)
Patient went to the ER yesterday for side pain, CT was normal, was sent home. Patient has had nausea and vomiting and per mom is still having side pain.  What should they do?

## 2018-12-27 NOTE — Progress Notes (Signed)
   Subjective:    Patient ID: Colleen Howard, female    DOB: Mar 10, 1996, 23 y.o.   MRN: [id number]  HPI Patient went to the ER yesterday for side pain, CT was normal, was sent home. Patient has had nausea and vomiting and per mom is still having side pain.    Per Dr. Brett Canales -  reviewed all at length , scan neg for appendicitis, b w all good, the hi likelihood is pt had a rupture cyst on an ovary causes this kind of pain, take two advil three times per day or two aleave twice per d will likely fade over coming week.   Pt was notified and verbalized understanding. She had no questions at this time.   Virtual Visit via Telephone Note  I connected with Colleen Howard on 12/27/18 at  2:00 PM EDT by telephone and verified that I am speaking with the correct person using two identifiers.   I discussed the limitations, risks, security and privacy concerns of performing an evaluation and management service by telephone and the availability of in person appointments. I also discussed with the patient that there may be a patient responsible charge related to this service. The patient expressed understanding and agreed to proceed.   History of Present Illness:    Observations/Objective:   Assessment and Plan:   Follow Up Instructions:    I discussed the assessment and treatment plan with the patient. The patient was provided an opportunity to ask questions and all were answered. The patient agreed with the plan and demonstrated an understanding of the instructions.   The patient was advised to call back or seek an in-person evaluation if the symptoms worsen or if the condition fails to improve as anticipated.  I provided 10 minutes of non-face-to-face time during this encounter.   Kyra Manges, LPN     Review of Systems     Objective:   Physical Exam        Assessment & Plan:

## 2019-02-02 ENCOUNTER — Telehealth: Payer: Self-pay | Admitting: Family Medicine

## 2019-02-02 NOTE — Telephone Encounter (Signed)
Patient has had diarrhea,headache and threw up once but feeling better after taking over the counter medication but needing a work excuse for 5/2-5/4 going back to work 5/7. Colleen Howard [phone]

## 2019-02-03 ENCOUNTER — Encounter: Payer: Self-pay | Admitting: Family Medicine

## 2019-02-03 NOTE — Telephone Encounter (Signed)
Pt is not having cough, fever or respiratory symptoms. Please give work excuse for 5/2 - 5/4 and may return 5/7. Pt prefers to get note through mychart if unable to do that please call pt. thanks

## 2019-02-03 NOTE — Telephone Encounter (Signed)
Please talk with patient Verify that there were no respiratory symptoms Verify there is no fever, cough etc.  If negative see the above then may have work note

## 2019-02-03 NOTE — Telephone Encounter (Signed)
Colleen Howard is calling back checking on work note. Informed her Dr. Brett Canales is not in and she would like to know if Dr. Lorin Picket can approve this. Pt works at the [employer] and they will not let her go back to work with out a work note.

## 2019-02-03 NOTE — Telephone Encounter (Signed)
Work note put in

## 2019-04-18 ENCOUNTER — Encounter: Payer: Self-pay | Admitting: Family Medicine

## 2019-04-18 ENCOUNTER — Ambulatory Visit (INDEPENDENT_AMBULATORY_CARE_PROVIDER_SITE_OTHER): Payer: Medicaid Other | Admitting: Family Medicine

## 2019-04-18 ENCOUNTER — Other Ambulatory Visit: Payer: Self-pay

## 2019-04-18 DIAGNOSIS — A749 Chlamydial infection, unspecified: Secondary | ICD-10-CM | POA: Diagnosis not present

## 2019-04-18 DIAGNOSIS — Z202 Contact with and (suspected) exposure to infections with a predominantly sexual mode of transmission: Secondary | ICD-10-CM

## 2019-04-18 MED ORDER — DOXYCYCLINE HYCLATE 100 MG PO TABS: 100.0000 mg | ORAL_TABLET | Freq: Two times a day (BID) | ORAL | 0 refills | Status: DC

## 2019-04-18 NOTE — Progress Notes (Signed)
   Subjective:  Audio plus video  Patient ID: Colleen Howard, female    DOB: 07/02/1996, 23 y.o.   MRN: [id number]  HPI pt states her partner told her today he was positive for chylamdia. She is not having any symptoms.  Patient states she has 1 partner  No dysuria no discharge no abdominal discomfort no fever  Partner currently is under treatment  Virtual Visit via Telephone Note  I connected with Colleen Howard on 04/18/19 at  2:00 PM EDT by telephone and verified that I am speaking with the correct person using two identifiers.  Location: Patient: home Provider: office   I discussed the limitations, risks, security and privacy concerns of performing an evaluation and management service by telephone and the availability of in person appointments. I also discussed with the patient that there may be a patient responsible charge related to this service. The patient expressed understanding and agreed to proceed.   History of Present Illness:    Observations/Objective:   Assessment and Plan:   Follow Up Instructions:    I discussed the assessment and treatment plan with the patient. The patient was provided an opportunity to ask questions and all were answered. The patient agreed with the plan and demonstrated an understanding of the instructions.   The patient was advised to call back or seek an in-person evaluation if the symptoms worsen or if the condition fails to improve as anticipated.  I provide 18 minutes of non-face-to-face time during this encounter.      Review of Systems No headache, no major weight loss or weight gain, no chest pain no back pain abdominal pain no change in bowel habits complete ROS otherwise negative     Objective:   Physical Exam  Virtual      Assessment & Plan:  Impression positive chlamydia exposure.  Discussed.  Discussed potential for false negatives with screening.  We will go ahead and treat with Doxy 100 twice daily.

## 2019-04-19 ENCOUNTER — Telehealth: Payer: Self-pay | Admitting: Women's Health

## 2019-04-19 NOTE — Telephone Encounter (Signed)
Pt would like to come in to be tested for a STD.

## 2019-04-20 ENCOUNTER — Telehealth: Payer: Self-pay | Admitting: *Deleted

## 2019-04-20 NOTE — Telephone Encounter (Signed)
LMOVM returning patient's call.  

## 2019-04-21 ENCOUNTER — Telehealth: Payer: Self-pay | Admitting: *Deleted

## 2019-04-21 NOTE — Telephone Encounter (Signed)
LMOVM that the treatment from Dr Wolfgang Phoenix is appropriate for potential exposure.  Informed she would be a new patient at our office and would need an appointment to be tested.

## 2019-06-07 ENCOUNTER — Other Ambulatory Visit: Payer: Self-pay | Admitting: *Deleted

## 2019-06-07 ENCOUNTER — Telehealth: Payer: Self-pay | Admitting: Family Medicine

## 2019-06-07 MED ORDER — PREDNISONE 20 MG PO TABS: ORAL_TABLET | ORAL | 0 refills | Status: DC

## 2019-06-07 NOTE — Telephone Encounter (Signed)
Adult pred taper 

## 2019-06-07 NOTE — Telephone Encounter (Signed)
Patient has poison oak on arms ,legs,side. Requesting prensidone to be called in [place]

## 2019-06-07 NOTE — Telephone Encounter (Signed)
Med sent to pharm. Number left on message does not work so unable to get in touch with pt.

## 2019-07-04 ENCOUNTER — Other Ambulatory Visit: Payer: Self-pay

## 2019-07-04 ENCOUNTER — Encounter: Payer: Self-pay | Admitting: Family Medicine

## 2019-07-04 ENCOUNTER — Ambulatory Visit (INDEPENDENT_AMBULATORY_CARE_PROVIDER_SITE_OTHER): Payer: Medicaid Other | Admitting: Family Medicine

## 2019-07-04 DIAGNOSIS — R21 Rash and other nonspecific skin eruption: Secondary | ICD-10-CM

## 2019-07-04 MED ORDER — PREDNISONE 20 MG PO TABS: ORAL_TABLET | ORAL | 0 refills | Status: DC

## 2019-07-04 NOTE — Progress Notes (Signed)
   Subjective:  Audio plus video  Patient ID: Colleen Howard, female    DOB: 05/17/1996, 23 y.o.   MRN: [id number]  Rash This is a new problem. Episode onset: 4 days. The affected locations include the back, abdomen and neck. The rash is characterized by redness and itchiness. She was exposed to nothing. (Temp the past 2 days have been 99) Past treatments include anti-itch cream (cortisone, benadryl). The treatment provided mild relief.  pt would like doctors note sent through [place].  Virtual Visit via Video Note  I connected with Maikayla A Iglehart on 07/04/19 at  2:00 PM EDT by a video enabled telemedicine application and verified that I am speaking with the correct person using two identifiers.  Location: Patient: home Provider: office   I discussed the limitations of evaluation and management by telemedicine and the availability of in person appointments. The patient expressed understanding and agreed to proceed.  History of Present Illness:    Observations/Objective:   Assessment and Plan:   Follow Up Instructions:    I discussed the assessment and treatment plan with the patient. The patient was provided an opportunity to ask questions and all were answered. The patient agreed with the plan and demonstrated an understanding of the instructions.   The patient was advised to call back or seek an in-person evaluation if the symptoms worsen or if the condition fails to improve as anticipated.  I provided 17 minutes of non-face-to-face time during this encounter.   Vicente Males, LPN  Patient notes very pruritic patchy rash noted on trunk and extremities and neck.  Did get outdoors.  Patient's dog went on to high grass symptoms of the woods.  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  Cortisone not helping much.  Benadryl not really helping either.  Itching fairly severe   Review of Systems  Skin: Positive for rash.       Objective:   Physical Exam   Virtual patchy rash noted on neck during visit      Assessment & Plan:  Impression diffuse severe poison ivy allergy rash.  Symptom care discussed warning signs discussed prednisone taper

## 2019-10-22 ENCOUNTER — Telehealth: Payer: Self-pay

## 2019-10-22 NOTE — Telephone Encounter (Signed)
Pt called for covid results. Pt stated she was tested through her job. Advised pt that there are not pending results and number to [facility] given to pt. Pt verbalized understanding.

## 2019-10-26 ENCOUNTER — Encounter: Payer: Self-pay | Admitting: Family Medicine

## 2019-10-27 ENCOUNTER — Encounter: Payer: Self-pay | Admitting: Family Medicine

## 2019-11-28 ENCOUNTER — Ambulatory Visit (INDEPENDENT_AMBULATORY_CARE_PROVIDER_SITE_OTHER): Payer: Self-pay | Admitting: Family Medicine

## 2019-11-28 ENCOUNTER — Encounter (HOSPITAL_COMMUNITY): Payer: Self-pay | Admitting: *Deleted

## 2019-11-28 ENCOUNTER — Encounter: Payer: Self-pay | Admitting: Family Medicine

## 2019-11-28 ENCOUNTER — Other Ambulatory Visit: Payer: Self-pay

## 2019-11-28 ENCOUNTER — Emergency Department (HOSPITAL_COMMUNITY)
Admission: EM | Admit: 2019-11-28 | Discharge: 2019-11-29 | Disposition: A | Discharge status: Home / Self Care | Payer: Medicaid Other | Attending: Emergency Medicine | Admitting: Emergency Medicine

## 2019-11-28 DIAGNOSIS — B349 Viral infection, unspecified: Secondary | ICD-10-CM

## 2019-11-28 DIAGNOSIS — R197 Diarrhea, unspecified: Secondary | ICD-10-CM | POA: Insufficient documentation

## 2019-11-28 DIAGNOSIS — Z20822 Contact with and (suspected) exposure to covid-19: Secondary | ICD-10-CM

## 2019-11-28 DIAGNOSIS — R112 Nausea with vomiting, unspecified: Secondary | ICD-10-CM | POA: Insufficient documentation

## 2019-11-28 MED ORDER — LACTATED RINGERS IV BOLUS
1000.0000 mL | Freq: Once | INTRAVENOUS | Status: AC
  Administered 2019-11-29: 1000 mL via INTRAVENOUS

## 2019-11-28 MED ORDER — DICYCLOMINE HCL 10 MG PO CAPS
10.0000 mg | ORAL_CAPSULE | Freq: Once | ORAL | Status: AC
  Administered 2019-11-28: 10 mg via ORAL
  Filled 2019-11-28: qty 1

## 2019-11-28 MED ORDER — ONDANSETRON HCL 4 MG/2ML IJ SOLN
4.0000 mg | Freq: Once | INTRAMUSCULAR | Status: AC
  Administered 2019-11-29: 4 mg via INTRAVENOUS
  Filled 2019-11-28: qty 2

## 2019-11-28 NOTE — ED Triage Notes (Signed)
Pt c/o n/v/d x 2 days; pt states she called her PCP and was told she might have Covid; pt denies any fevers or lost of taste and smell

## 2019-11-28 NOTE — ED Provider Notes (Signed)
Emergency Department Provider Note   I have reviewed the triage vital signs and the nursing notes.   HISTORY  Chief Complaint Emesis   HPI Colleen Howard is a 24 y.o. female who presents to the emergency department today secondary to nausea vomiting diarrhea.  Patient states she became sick on Saturday but it was not too bad Sunday she had significant amounts of vomiting and diarrhea and then today the vomiting seems to have improved some but still feels nauseous and crampy stomach but still having significant bouts of watery diarrhea.  No blood in either 1.  No fevers, body aches, respiratory symptoms.  She has no back pain or urinary symptoms.  Last menstrual cycle was at the beginning of last month.  She states she called her doctor who told her he could not tell her whether or not was Covid without testing her presents here for further management.  No sick contacts.  No suspicious food intake.  She works at the [employer] and is exposed to multiple sick people but not anybody closely.  She wears the proper PPE.   No other associated or modifying symptoms.    Past Medical History:  Diagnosis Date  . Kidney stone   . Migraine headache     Patient Active Problem List   Diagnosis Date Noted  . Migraines 07/07/2013    Past Surgical History:  Procedure Laterality Date  . tubes in ear      Current Outpatient Rx  . Order #: [id number] Class: Historical Med  . Order #: [id number] Class: Print  . Order #: [id number] Class: Historical Med  . Order #: [id number] Class: Normal  . Order #: [id number] Class: Print    Allergies Bactrim [sulfamethoxazole-trimethoprim], Codeine, and Hydrocodone  History reviewed. No pertinent family history.  Social History Social History   Tobacco Use  . Smoking status: Never Smoker  . Smokeless tobacco: Never Used  Substance Use Topics  . Alcohol use: No  . Drug use: No    Review of Systems  All other systems negative except as documented  in the HPI. All pertinent positives and negatives as reviewed in the HPI. ____________________________________________   PHYSICAL EXAM:  VITAL SIGNS: ED Triage Vitals  Enc Vitals Group     BP 11/28/19 2317 118/76     Pulse Rate 11/28/19 2317 78     Resp 11/28/19 2317 16     Temp 11/28/19 2317 98.2 F (36.8 C)     Temp Source 11/28/19 2317 Oral     SpO2 11/28/19 2317 99 %     Weight 11/28/19 2318 120 lb (54.4 kg)     Height 11/28/19 2318 5\' 2"  (1.575 m)     Head Circumference --      Peak Flow --      Pain Score 11/28/19 2318 0     Pain Loc --      Pain Edu? --      Excl. in GC? --     Constitutional: Alert and oriented. Well appearing and in no acute distress. Eyes: Conjunctivae are normal. PERRL. EOMI. Head: Atraumatic. Nose: No congestion/rhinnorhea. Mouth/Throat: Mucous membranes are moist.  Oropharynx non-erythematous. Neck: No stridor.  No meningeal signs.   Cardiovascular: Normal rate, regular rhythm. Good peripheral circulation. Grossly normal heart sounds.   Respiratory: Normal respiratory effort.  No retractions. Lungs CTAB. Gastrointestinal: Soft and nontender. No distention.  Musculoskeletal: No lower extremity tenderness nor edema. No gross deformities of extremities. Neurologic:  Normal speech and language. No gross  focal neurologic deficits are appreciated.  Skin:  Skin is warm, dry and intact. No rash noted.   ____________________________________________   LABS (all labs ordered are listed, but only abnormal results are displayed)  Labs Reviewed  URINALYSIS, ROUTINE W REFLEX MICROSCOPIC - Abnormal; Notable for the following components:      Result Value   APPearance HAZY (*)    Leukocytes,Ua TRACE (*)    All other components within normal limits  GI PATHOGEN PANEL BY PCR, STOOL  C DIFFICILE QUICK SCREEN W PCR REFLEX  CBC WITH DIFFERENTIAL/PLATELET  BASIC METABOLIC PANEL  POC URINE PREG, ED    ____________________________________________  EKG   EKG Interpretation  Date/Time:    Ventricular Rate:    PR Interval:    QRS Duration:   QT Interval:    QTC Calculation:   R Axis:     Text Interpretation:         ____________________________________________  RADIOLOGY  No results found.  ____________________________________________   PROCEDURES  Procedure(s) performed:   Procedures   ____________________________________________   INITIAL IMPRESSION / ASSESSMENT AND PLAN / ED COURSE  Mildly dehydrated will give IV fluids will work on on nausea control.  As she has had 3 days of diarrhea could be bacterial but still within the range of viral see if we get a stool study here but still treat as a viral GI illness at this time.  No focal tenderness in her abdomen or fever to suggest intra-abdominal pathology such as obstruction or volvulus or appendicitis.  Here for over 4 hours without diarrhea or vomiting. Suspect it has improved. Abdomen still benign. Dc with anti-emetics. pcp follow up.     Pertinent labs & imaging results that were available during my care of the patient were reviewed by me and considered in my medical decision making (see chart for details).  A medical screening exam was performed and I feel the patient has had an appropriate workup for their chief complaint at this time and likelihood of emergent condition existing is low. They have been counseled on decision, discharge, follow up and which symptoms necessitate immediate return to the emergency department. They or their family verbally stated understanding and agreement with plan and discharged in stable condition.   ____________________________________________  FINAL CLINICAL IMPRESSION(S) / ED DIAGNOSES  Final diagnoses:  Nausea vomiting and diarrhea     MEDICATIONS GIVEN DURING THIS VISIT:  Medications  metoCLOPramide (REGLAN) injection 10 mg (has no administration in time range)   lactated ringers bolus 1,000 mL (0 mLs Intravenous Stopped 11/29/19 0124)  ondansetron (ZOFRAN) injection 4 mg (4 mg Intravenous Given 11/29/19 0005)  dicyclomine (BENTYL) capsule 10 mg (10 mg Oral Given 11/28/19 2358)  lactated ringers bolus 1,000 mL (1,000 mLs Intravenous New Bag/Given 11/29/19 0222)     NEW OUTPATIENT MEDICATIONS STARTED DURING THIS VISIT:  New Prescriptions   DICYCLOMINE (BENTYL) 20 MG TABLET    Take 1 tablet (20 mg total) by mouth 2 (two) times daily as needed for spasms (abdominal cramping).   METOCLOPRAMIDE (REGLAN) 10 MG TABLET    Take 1 tablet (10 mg total) by mouth every 6 (six) hours as needed for nausea (nausea/headache).    Note:  This note was prepared with assistance of Dragon voice recognition software. Occasional wrong-word or sound-a-like substitutions may have occurred due to the inherent limitations of voice recognition software.   Derron Pipkins, Corene Cornea, MD 11/29/19 [id number]

## 2019-11-28 NOTE — Progress Notes (Signed)
   Subjective:    Patient ID: Colleen Howard, female    DOB: [birth year], 24 y.o.   MRN: [id number]  Sinusitis This is a new problem. Episode onset: 2 days. There has been no fever. Associated symptoms include congestion, headaches and a sore throat. Pertinent negatives include no coughing, ear pain or shortness of breath. (Diarrhea) Treatments tried: throat spray, ibuprofen.  gets tested for covid twice a week at her job and suppose to get tested tomorrow.  Patient works at [employer] Patient also works at [employer] She came down with a sore throat then she started having nausea and then diarrhea.  Denies high fever chills sweats.  PMH benign. Virtual Visit via Telephone Note  I connected with Tenlee A Guereca on 11/28/19 at  3:50 PM EST by telephone and verified that I am speaking with the correct person using two identifiers.  Location: Patient: home Provider: office   I discussed the limitations, risks, security and privacy concerns of performing an evaluation and management service by telephone and the availability of in person appointments. I also discussed with the patient that there may be a patient responsible charge related to this service. The patient expressed understanding and agreed to proceed.   History of Present Illness:    Observations/Objective:   Assessment and Plan:   Follow Up Instructions:    I discussed the assessment and treatment plan with the patient. The patient was provided an opportunity to ask questions and all were answered. The patient agreed with the plan and demonstrated an understanding of the instructions.   The patient was advised to call back or seek an in-person evaluation if the symptoms worsen or if the condition fails to improve as anticipated.  I provided 18 minutes of non-face-to-face time during this encounter.       Review of Systems  Constitutional: Negative for activity change and fever.  HENT: Positive  for congestion and sore throat. Negative for ear pain and rhinorrhea.   Eyes: Negative for discharge.  Respiratory: Negative for cough, shortness of breath and wheezing.   Cardiovascular: Negative for chest pain.  Gastrointestinal: Positive for diarrhea and nausea. Negative for constipation.  Neurological: Positive for headaches.       Objective:   Physical Exam  Today's visit was via telephone Physical exam was not possible for this visit       Assessment & Plan:  Viral syndrome Probable Covid Recommend testing in the morning She is an employee of [employer] She will call to health at work in order to arrange for her to get testing tomorrow Work excuse given for the rest of the week for both of her jobs If the test is positive or if she has severe symptoms she should stay out of work for at least 10 days

## 2019-11-29 LAB — URINALYSIS, ROUTINE W REFLEX MICROSCOPIC
Bacteria, UA: NONE SEEN
Bilirubin Urine: NEGATIVE
Glucose, UA: NEGATIVE mg/dL
Hgb urine dipstick: NEGATIVE
Ketones, ur: NEGATIVE mg/dL
Nitrite: NEGATIVE
Protein, ur: NEGATIVE mg/dL
Specific Gravity, Urine: 1.027 (ref 1.005–1.030)
pH: 5 (ref 5.0–8.0)

## 2019-11-29 LAB — CBC WITH DIFFERENTIAL/PLATELET
Abs Immature Granulocytes: 0.01 10*3/uL (ref 0.00–0.07)
Basophils Absolute: 0 10*3/uL (ref 0.0–0.1)
Basophils Relative: 0 %
Eosinophils Absolute: 0.1 10*3/uL (ref 0.0–0.5)
Eosinophils Relative: 2 %
HCT: 42.8 % (ref 36.0–46.0)
Hemoglobin: 13.9 g/dL (ref 12.0–15.0)
Immature Granulocytes: 0 %
Lymphocytes Relative: 37 %
Lymphs Abs: 2.7 10*3/uL (ref 0.7–4.0)
MCH: 30.5 pg (ref 26.0–34.0)
MCHC: 32.5 g/dL (ref 30.0–36.0)
MCV: 93.9 fL (ref 80.0–100.0)
Monocytes Absolute: 0.6 10*3/uL (ref 0.1–1.0)
Monocytes Relative: 8 %
Neutro Abs: 3.9 10*3/uL (ref 1.7–7.7)
Neutrophils Relative %: 53 %
Platelets: 250 10*3/uL (ref 150–400)
RBC: 4.56 MIL/uL (ref 3.87–5.11)
RDW: 12.4 % (ref 11.5–15.5)
WBC: 7.4 10*3/uL (ref 4.0–10.5)
nRBC: 0 % (ref 0.0–0.2)

## 2019-11-29 LAB — BASIC METABOLIC PANEL
Anion gap: 8 (ref 5–15)
BUN: 10 mg/dL (ref 6–20)
CO2: 27 mmol/L (ref 22–32)
Calcium: 9.2 mg/dL (ref 8.9–10.3)
Chloride: 102 mmol/L (ref 98–111)
Creatinine, Ser: 0.68 mg/dL (ref 0.44–1.00)
GFR calc Af Amer: >60 mL/min (ref >60)
GFR calc non Af Amer: >60 mL/min (ref >60)
Glucose, Bld: 82 mg/dL (ref 70–99)
Potassium: 3.6 mmol/L (ref 3.5–5.1)
Sodium: 137 mmol/L (ref 135–145)

## 2019-11-29 LAB — POC URINE PREG, ED: Preg Test, Ur: NEGATIVE

## 2019-11-29 MED ORDER — DICYCLOMINE HCL 20 MG PO TABS: 20.0000 mg | ORAL_TABLET | Freq: Two times a day (BID) | ORAL | 0 refills | Status: DC | PRN

## 2019-11-29 MED ORDER — METOCLOPRAMIDE HCL 5 MG/ML IJ SOLN: 10.0000 mg | Freq: Once | INTRAMUSCULAR | Status: DC | PRN

## 2019-11-29 MED ORDER — LACTATED RINGERS IV BOLUS
1000.0000 mL | Freq: Once | INTRAVENOUS | Status: AC
  Administered 2019-11-29: 1000 mL via INTRAVENOUS

## 2019-11-29 MED ORDER — METOCLOPRAMIDE HCL 10 MG PO TABS: 10.0000 mg | ORAL_TABLET | Freq: Four times a day (QID) | ORAL | 0 refills | Status: DC | PRN

## 2019-12-07 MED ORDER — LEVONORGESTREL-ETHINYL ESTRAD 0.1-20 MG-MCG PO TABS: 1.0000 | ORAL_TABLET | Freq: Every day | ORAL | 11 refills | Status: DC

## 2019-12-07 NOTE — Addendum Note (Signed)
Addended by: Merlyn Albert on: 12/07/2019 09:58 AM   Modules accepted: Orders

## 2020-01-15 ENCOUNTER — Encounter (HOSPITAL_COMMUNITY): Payer: Self-pay | Admitting: Emergency Medicine

## 2020-01-15 ENCOUNTER — Other Ambulatory Visit: Payer: Self-pay

## 2020-01-15 ENCOUNTER — Emergency Department (HOSPITAL_COMMUNITY): Payer: Self-pay

## 2020-01-15 ENCOUNTER — Emergency Department (HOSPITAL_COMMUNITY)
Admission: EM | Admit: 2020-01-15 | Discharge: 2020-01-16 | Disposition: A | Discharge status: Home / Self Care | Payer: Self-pay | Attending: Emergency Medicine | Admitting: Emergency Medicine

## 2020-01-15 DIAGNOSIS — Z79899 Other long term (current) drug therapy: Secondary | ICD-10-CM | POA: Insufficient documentation

## 2020-01-15 DIAGNOSIS — Z20822 Contact with and (suspected) exposure to covid-19: Secondary | ICD-10-CM

## 2020-01-15 DIAGNOSIS — R197 Diarrhea, unspecified: Secondary | ICD-10-CM

## 2020-01-15 DIAGNOSIS — E860 Dehydration: Secondary | ICD-10-CM

## 2020-01-15 LAB — RESPIRATORY PANEL BY RT PCR (FLU A&B, COVID)
Influenza A by PCR: NEGATIVE
Influenza B by PCR: NEGATIVE
SARS Coronavirus 2 by RT PCR: NEGATIVE

## 2020-01-15 LAB — CBC WITH DIFFERENTIAL/PLATELET
Abs Immature Granulocytes: 0.01 10*3/uL (ref 0.00–0.07)
Basophils Absolute: 0 10*3/uL (ref 0.0–0.1)
Basophils Relative: 1 %
Eosinophils Absolute: 0.2 10*3/uL (ref 0.0–0.5)
Eosinophils Relative: 3 %
HCT: 40.7 % (ref 36.0–46.0)
Hemoglobin: 13.5 g/dL (ref 12.0–15.0)
Immature Granulocytes: 0 %
Lymphocytes Relative: 34 %
Lymphs Abs: 2.2 10*3/uL (ref 0.7–4.0)
MCH: 31.2 pg (ref 26.0–34.0)
MCHC: 33.2 g/dL (ref 30.0–36.0)
MCV: 94 fL (ref 80.0–100.0)
Monocytes Absolute: 0.7 10*3/uL (ref 0.1–1.0)
Monocytes Relative: 10 %
Neutro Abs: 3.4 10*3/uL (ref 1.7–7.7)
Neutrophils Relative %: 52 %
Platelets: 256 10*3/uL (ref 150–400)
RBC: 4.33 MIL/uL (ref 3.87–5.11)
RDW: 12.2 % (ref 11.5–15.5)
WBC: 6.5 10*3/uL (ref 4.0–10.5)
nRBC: 0 % (ref 0.0–0.2)

## 2020-01-15 LAB — URINALYSIS, ROUTINE W REFLEX MICROSCOPIC
Bilirubin Urine: NEGATIVE
Glucose, UA: NEGATIVE mg/dL
Hgb urine dipstick: NEGATIVE
Ketones, ur: NEGATIVE mg/dL
Leukocytes,Ua: NEGATIVE
Nitrite: NEGATIVE
Protein, ur: NEGATIVE mg/dL
Specific Gravity, Urine: 1.011 (ref 1.005–1.030)
pH: 5 (ref 5.0–8.0)

## 2020-01-15 LAB — COMPREHENSIVE METABOLIC PANEL
ALT: 18 U/L (ref 0–44)
AST: 20 U/L (ref 15–41)
Albumin: 4.3 g/dL (ref 3.5–5.0)
Alkaline Phosphatase: 68 U/L (ref 38–126)
Anion gap: 11 (ref 5–15)
BUN: 9 mg/dL (ref 6–20)
CO2: 24 mmol/L (ref 22–32)
Calcium: 9.2 mg/dL (ref 8.9–10.3)
Chloride: 101 mmol/L (ref 98–111)
Creatinine, Ser: 0.67 mg/dL (ref 0.44–1.00)
GFR calc Af Amer: >60 mL/min (ref >60)
GFR calc non Af Amer: >60 mL/min (ref >60)
Glucose, Bld: 87 mg/dL (ref 70–99)
Potassium: 3.5 mmol/L (ref 3.5–5.1)
Sodium: 136 mmol/L (ref 135–145)
Total Bilirubin: 0.3 mg/dL (ref 0.3–1.2)
Total Protein: 7.4 g/dL (ref 6.5–8.1)

## 2020-01-15 LAB — I-STAT BETA HCG BLOOD, ED (MC, WL, AP ONLY): I-stat hCG, quantitative: <5 m[IU]/mL (ref <5)

## 2020-01-15 LAB — LIPASE, BLOOD: Lipase: 34 U/L (ref 11–51)

## 2020-01-15 MED ORDER — SODIUM CHLORIDE 0.9 % IV BOLUS
1000.0000 mL | Freq: Once | INTRAVENOUS | Status: AC
  Administered 2020-01-15: 1000 mL via INTRAVENOUS

## 2020-01-15 MED ORDER — ONDANSETRON 4 MG PO TBDP
4.0000 mg | ORAL_TABLET | Freq: Once | ORAL | Status: AC
  Administered 2020-01-15: 22:00:00 4 mg via ORAL
  Filled 2020-01-15: qty 1

## 2020-01-15 MED ORDER — ACETAMINOPHEN 325 MG PO TABS
650.0000 mg | ORAL_TABLET | Freq: Once | ORAL | Status: AC
  Administered 2020-01-15: 650 mg via ORAL
  Filled 2020-01-15: qty 2

## 2020-01-15 NOTE — ED Triage Notes (Signed)
Pt reports she has not chosen to take the offered covid vaccinations

## 2020-01-15 NOTE — ED Triage Notes (Signed)
Works at the [employer] "sick for the last 3 days" Came from work tonight after sitting on toilet and feeling weak and dizzy

## 2020-01-16 MED ORDER — ONDANSETRON 4 MG PO TBDP: 4.0000 mg | ORAL_TABLET | Freq: Three times a day (TID) | ORAL | 0 refills | Status: DC | PRN

## 2020-01-16 NOTE — Discharge Instructions (Signed)
While your Covid PCR test today was negative no test is 100%.    Please take Tylenol (acetaminophen) to relieve your pain.  You may take tylenol, up to 1,000 mg (two extra strength pills).  Do not take more than 3,000 mg tylenol in a 24 hour period.  Please check all medication labels as many medications such as pain and cold medications may contain tylenol. Please do not drink alcohol while taking this medication.

## 2020-01-16 NOTE — ED Notes (Signed)
Orthostatic vitals listed in flow chart

## 2020-01-16 NOTE — ED Provider Notes (Signed)
[facility] EMERGENCY DEPARTMENT Provider Note   CSN: [id number] Arrival date & time: 01/15/20  1957     History Chief Complaint  Patient presents with  . Weakness    felt sick    Colleen Howard is a 24 y.o. female with a past medical history of migraines who presents today for evaluation of multiple complaints. She works at the [employer] and reports that over the past 3 days she has had headache, diarrhea, cough, nasal drainage and generally feeling unwell.  She was at work today and started feeling weak and dizzy.  She had a antigen test performed there that was negative.  No chest pain or sore throat.   She reports that she gets diffuse abdominal pain prior to diarrhea and vomiting, however is otherwise pain free.   She denies any aggravating or alleviating symptoms.  No dysuria, increased frequency or urgency.  No abnormal vaginal bleeding or discharge.  She denies possibility of pregnancy.  No shortness of breath.    She has declined the covid vaccines.     HPI     Past Medical History:  Diagnosis Date  . Kidney stone   . Migraine headache     Patient Active Problem List   Diagnosis Date Noted  . Migraines 07/07/2013    Past Surgical History:  Procedure Laterality Date  . tubes in ear       OB History    Gravida  0   Para  0   Term  0   Preterm  0   AB  0   Living  0     SAB  0   TAB  0   Ectopic  0   Multiple  0   Live Births  0           No family history on file.  Social History   Tobacco Use  . Smoking status: Never Smoker  . Smokeless tobacco: Never Used  Substance Use Topics  . Alcohol use: No  . Drug use: No    Home Medications Prior to Admission medications   Medication Sig Start Date End Date Taking? Authorizing Provider  levonorgestrel-ethinyl estradiol (AVIANE) 0.1-20 MG-MCG tablet Take 1 tablet by mouth daily. 12/07/19  Yes Merlyn Albert, MD  acetaminophen (TYLENOL) 500 MG tablet Take 500 mg by mouth  every 6 (six) hours as needed for mild pain or moderate pain.    [provider]  dicyclomine (BENTYL) 20 MG tablet Take 1 tablet (20 mg total) by mouth 2 (two) times daily as needed for spasms (abdominal cramping). 11/29/19   Mesner, Barbara Cower, MD  ibuprofen (ADVIL,MOTRIN) 200 MG tablet Take 200 mg by mouth every 6 (six) hours as needed for mild pain or moderate pain.    [provider]  metoCLOPramide (REGLAN) 10 MG tablet Take 1 tablet (10 mg total) by mouth every 6 (six) hours as needed for nausea (nausea/headache). 11/29/19   Mesner, Barbara Cower, MD  ondansetron (ZOFRAN ODT) 4 MG disintegrating tablet Take 1 tablet (4 mg total) by mouth every 8 (eight) hours as needed for nausea or vomiting. 01/16/20   Cristina Gong, PA-C    Allergies    Bactrim [sulfamethoxazole-trimethoprim], Codeine, and Hydrocodone  Review of Systems   Review of Systems  Constitutional: Positive for appetite change and fatigue. Negative for chills and fever.  HENT: Positive for congestion and rhinorrhea. Negative for sore throat.   Respiratory: Positive for cough. Negative for shortness of breath.  Cardiovascular: Negative for chest pain.  Gastrointestinal: Positive for diarrhea, nausea and vomiting.  Genitourinary: Negative for dysuria.  Musculoskeletal: Positive for arthralgias and myalgias.  Skin: Negative for color change, rash and wound.  Neurological: Positive for light-headedness and headaches.  All other systems reviewed and are negative.   Physical Exam Updated Vital Signs BP 105/74   Pulse 60   Temp 98.1 F (36.7 C) (Oral)   Resp 16   Ht 5\' 6"  (1.676 m)   Wt 53.5 kg   LMP 01/09/2020   SpO2 99%   BMI 19.05 kg/m   Physical Exam Vitals and nursing note reviewed.  Constitutional:      Appearance: She is well-developed. She is not diaphoretic.     Comments: Occasional dry heaving.   HENT:     Head: Normocephalic and atraumatic.     Mouth/Throat:     Mouth: Mucous membranes are  moist.  Eyes:     Conjunctiva/sclera: Conjunctivae normal.  Cardiovascular:     Rate and Rhythm: Normal rate and regular rhythm.     Pulses: Normal pulses.     Heart sounds: Normal heart sounds. No murmur.  Pulmonary:     Effort: Pulmonary effort is normal. No respiratory distress.     Breath sounds: Normal breath sounds. No stridor.  Abdominal:     General: Abdomen is flat. Bowel sounds are normal. There is no distension.     Palpations: Abdomen is soft.     Tenderness: There is abdominal tenderness (LLQ). There is no guarding.  Genitourinary:    Comments: Patient refused Musculoskeletal:     Cervical back: Normal range of motion and neck supple.     Right lower leg: No edema.     Left lower leg: No edema.  Skin:    General: Skin is warm and dry.  Neurological:     General: No focal deficit present.     Mental Status: She is alert. Mental status is at baseline.     Sensory: No sensory deficit.     Motor: No weakness.     Coordination: Coordination normal.     Gait: Gait normal.  Psychiatric:        Mood and Affect: Mood normal.        Behavior: Behavior normal.     ED Results / Procedures / Treatments   Labs (all labs ordered are listed, but only abnormal results are displayed) Labs Reviewed  RESPIRATORY PANEL BY RT PCR (FLU A&B, COVID)  CBC WITH DIFFERENTIAL/PLATELET  COMPREHENSIVE METABOLIC PANEL  LIPASE, BLOOD  URINALYSIS, ROUTINE W REFLEX MICROSCOPIC  I-STAT BETA HCG BLOOD, ED ([facility], [facility], [facility] ONLY)    EKG None  Radiology DG Chest Port 1 View  Result Date: 01/15/2020 CLINICAL DATA:  Cough EXAM: PORTABLE CHEST 1 VIEW COMPARISON:  None. FINDINGS: The heart size and mediastinal contours are within normal limits. Both lungs are clear. The visualized skeletal structures are unremarkable. IMPRESSION: Negative. Electronically Signed   By: Rolm Baptise M.D.   On: 01/15/2020 22:18    Procedures Procedures (including critical care time)  Medications Ordered in  ED Medications  sodium chloride 0.9 % bolus 1,000 mL (0 mLs Intravenous Stopped 01/15/20 2304)  ondansetron (ZOFRAN-ODT) disintegrating tablet 4 mg (4 mg Oral Given 01/15/20 2148)  acetaminophen (TYLENOL) tablet 650 mg (650 mg Oral Given 01/15/20 2346)    ED Course  I have reviewed the triage vital signs and the nursing notes.  Pertinent labs & imaging results that were available  during my care of the patient were reviewed by me and considered in my medical decision making (see chart for details).    MDM Rules/Calculators/A&P                     Patient is a 24 year old woman who presents today for evaluation of nausea, diarrhea and generally feeling unwell for 3 days.  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  Multiple symptoms concerning for COVID-19 including headache postnasal drip, cough nausea vomiting and diarrhea.  Here she is given Zofran and IV fluids, after which she reported feeling better.  Chest x-ray obtained without evidence of consolidation or other acute abnormality.  Covid PCR test here is negative.  Labs are obtained and reviewed, CBC, CMP, lipase and UA are all unremarkable.  Here she is afebrile not tachycardic or tachypneic.  Pregnancy test is negative.  Patient did have mild left lower abdominal pain on exam.  I recommended a pelvic exam and discussed the purpose of this which she refused.  She denies any vaginal discharge or concerns for STD.    We discussed options for additional evaluation.  Patient p.o. challenge, and after which she was nauseous.  She was offered additional antiemetics and evaluation which she refused stating that she just wanted to go home.  She is given a prescription for Zofran ODT.  We discussed the importance of p.o. hydration and she states her understanding.  Return precautions were discussed with patient who states their understanding.  At the time of discharge patient denied any unaddressed complaints or concerns.   Patient is agreeable for discharge home.  Note: Portions of this report may have been transcribed using voice recognition software. Every effort was made to ensure accuracy; however, inadvertent computerized transcription errors may be present   Trianna A Armenteros was evaluated in Emergency Department on 01/16/2020 for the symptoms described in the history of present illness. She was evaluated in the context of the global COVID-19 pandemic, which necessitated consideration that the patient might be at risk for infection with the SARS-CoV-2 virus that causes COVID-19. Institutional protocols and algorithms that pertain to the evaluation of patients at risk for COVID-19 are in a state of rapid change based on information released by regulatory bodies including the CDC and federal and state organizations. These policies and algorithms were followed during the patient's care in the ED.  Final Clinical Impression(s) / ED Diagnoses Final diagnoses:  Dehydration  Diarrhea, unspecified type  Suspected COVID-19 virus infection    Rx / DC Orders ED Discharge Orders         Ordered    ondansetron (ZOFRAN ODT) 4 MG disintegrating tablet  Every 8 hours PRN     01/16/20 0104           Cristina Gong, PA-C 01/16/20 0132    Terald Sleeper, MD 01/16/20 1236

## 2020-04-16 ENCOUNTER — Encounter: Payer: Self-pay | Admitting: Family Medicine

## 2020-04-16 ENCOUNTER — Other Ambulatory Visit: Payer: Self-pay

## 2020-04-16 ENCOUNTER — Ambulatory Visit (INDEPENDENT_AMBULATORY_CARE_PROVIDER_SITE_OTHER): Payer: Self-pay | Admitting: Family Medicine

## 2020-04-16 VITALS — BP 112/72 | HR 86 | Temp 97.8°F | Ht 63.0 in | Wt 112.6 lb

## 2020-04-16 DIAGNOSIS — F329 Major depressive disorder, single episode, unspecified: Secondary | ICD-10-CM

## 2020-04-16 DIAGNOSIS — F32A Depression, unspecified: Secondary | ICD-10-CM

## 2020-04-16 DIAGNOSIS — F419 Anxiety disorder, unspecified: Secondary | ICD-10-CM

## 2020-04-16 MED ORDER — SERTRALINE HCL 50 MG PO TABS: ORAL_TABLET | ORAL | 0 refills | Status: DC

## 2020-04-16 MED ORDER — HYDROXYZINE HCL 10 MG PO TABS: 10.0000 mg | ORAL_TABLET | Freq: Three times a day (TID) | ORAL | 0 refills | Status: DC | PRN

## 2020-04-16 NOTE — Progress Notes (Signed)
Patient ID: Colleen Howard, female    DOB: 12/29/1995, 24 y.o.   MRN: [id number]   Chief Complaint  Patient presents with  . anxiety and depression    months-worse the last few days   Subjective:    HPI Pt having some concerns of worsening depression and anxiety in past month.  Had new job change in the last month.  Also had aunt that she was close to died last week.  Feeling last several weeks that not wanting to get out and dec motivation to do anything.  Has dec appetite.  Sits in dark at times.  Not sleeping much.  But if she does sleep a long times wakes up feeling tired still.  No h/o mom or dad with mental illness.  Brother with h/o schizophrenia and bipolar disorder.  Feels she has good support at home with mom and sister.  LMP- 03/10/20.  Currently on bcp.  Not skipped any pills.     Medical History Nikkie has a past medical history of Kidney stone and Migraine headache.   Outpatient Encounter Medications as of 04/16/2020  Medication Sig  . acetaminophen (TYLENOL) 500 MG tablet Take 500 mg by mouth every 6 (six) hours as needed for mild pain or moderate pain.  Marland Kitchen dicyclomine (BENTYL) 20 MG tablet Take 1 tablet (20 mg total) by mouth 2 (two) times daily as needed for spasms (abdominal cramping). (Patient not taking: Reported on 04/16/2020)  . hydrOXYzine (ATARAX/VISTARIL) 10 MG tablet Take 1 tablet (10 mg total) by mouth 3 (three) times daily as needed for anxiety.  Marland Kitchen ibuprofen (ADVIL,MOTRIN) 200 MG tablet Take 200 mg by mouth every 6 (six) hours as needed for mild pain or moderate pain.  Marland Kitchen levonorgestrel-ethinyl estradiol (AVIANE) 0.1-20 MG-MCG tablet Take 1 tablet by mouth daily.  . metoCLOPramide (REGLAN) 10 MG tablet Take 1 tablet (10 mg total) by mouth every 6 (six) hours as needed for nausea (nausea/headache). (Patient not taking: Reported on 04/16/2020)  . ondansetron (ZOFRAN ODT) 4 MG disintegrating tablet Take 1 tablet (4 mg total) by mouth every 8 (eight) hours as  needed for nausea or vomiting. (Patient not taking: Reported on 04/16/2020)  . sertraline (ZOLOFT) 50 MG tablet Take 1/2 tab p.o. for 7 days, then take 1 tab daily.   No facility-administered encounter medications on file as of 04/16/2020.    Review of Systems  Constitutional: Negative for chills and fever.  HENT: Negative for congestion, rhinorrhea and sore throat.   Respiratory: Negative for cough, shortness of breath and wheezing.   Cardiovascular: Negative for chest pain and leg swelling.  Gastrointestinal: Negative for abdominal pain, diarrhea, nausea and vomiting.  Genitourinary: Negative for dysuria and frequency.  Musculoskeletal: Negative for arthralgias and back pain.  Skin: Negative for rash.  Neurological: Negative for dizziness, weakness and headaches.  Psychiatric/Behavioral: Positive for dysphoric mood and sleep disturbance. Negative for agitation, behavioral problems, decreased concentration, hallucinations, self-injury and suicidal ideas. The patient is nervous/anxious. The patient is not hyperactive.     Vitals BP 112/72   Pulse 86   Temp 97.8 F (36.6 C) (Oral)   Ht 5\' 3"  (1.6 m)   Wt 112 lb 9.6 oz (51.1 kg)   SpO2 99%   BMI 19.95 kg/m   Objective:   Physical Exam Vitals and nursing note reviewed.  Constitutional:      General: She is not in acute distress.    Appearance: Normal appearance. She is not ill-appearing.  HENT:  Head: Normocephalic and atraumatic.  Cardiovascular:     Rate and Rhythm: Normal rate and regular rhythm.     Pulses: Normal pulses.     Heart sounds: Normal heart sounds.  Pulmonary:     Effort: Pulmonary effort is normal.     Breath sounds: Normal breath sounds. No wheezing, rhonchi or rales.  Musculoskeletal:        General: Normal range of motion.     Right lower leg: No edema.     Left lower leg: No edema.  Skin:    General: Skin is warm and dry.     Findings: No lesion or rash.  Neurological:     General: No focal  deficit present.     Mental Status: She is alert and oriented to person, place, and time.     Cranial Nerves: No cranial nerve deficit.  Psychiatric:        Behavior: Behavior normal.        Thought Content: Thought content normal.        Judgment: Judgment normal.     Comments: +flat affect      Assessment and Plan   1. Depression, unspecified depression type - sertraline (ZOLOFT) 50 MG tablet; Take 1/2 tab p.o. for 7 days, then take 1 tab daily.  Dispense: 30 tablet; Refill: 0 - Ambulatory referral to Psychology  2. Anxiety - sertraline (ZOLOFT) 50 MG tablet; Take 1/2 tab p.o. for 7 days, then take 1 tab daily.  Dispense: 30 tablet; Refill: 0 - hydrOXYzine (ATARAX/VISTARIL) 10 MG tablet; Take 1 tablet (10 mg total) by mouth 3 (three) times daily as needed for anxiety.  Dispense: 30 tablet; Refill: 0   Trial of zoloft and hydroxyzine. reviewed side effects of zoloft and hydroxyzine and to call if having any concerns.  F/u in 3-4 wks for recheck.  Call if worsening or any SI.  Pt in agreement.

## 2020-04-29 DIAGNOSIS — Z419 Encounter for procedure for purposes other than remedying health state, unspecified: Secondary | ICD-10-CM | POA: Diagnosis not present

## 2020-05-05 DIAGNOSIS — O99891 Other specified diseases and conditions complicating pregnancy: Secondary | ICD-10-CM | POA: Diagnosis not present

## 2020-05-05 DIAGNOSIS — Z3A01 Less than 8 weeks gestation of pregnancy: Secondary | ICD-10-CM | POA: Diagnosis not present

## 2020-05-05 DIAGNOSIS — O2341 Unspecified infection of urinary tract in pregnancy, first trimester: Secondary | ICD-10-CM | POA: Diagnosis not present

## 2020-05-05 DIAGNOSIS — R110 Nausea: Secondary | ICD-10-CM | POA: Diagnosis not present

## 2020-05-05 DIAGNOSIS — R109 Unspecified abdominal pain: Secondary | ICD-10-CM | POA: Diagnosis not present

## 2020-05-05 DIAGNOSIS — R1030 Lower abdominal pain, unspecified: Secondary | ICD-10-CM | POA: Diagnosis not present

## 2020-05-05 DIAGNOSIS — O26891 Other specified pregnancy related conditions, first trimester: Secondary | ICD-10-CM | POA: Diagnosis not present

## 2020-05-05 DIAGNOSIS — R102 Pelvic and perineal pain: Secondary | ICD-10-CM | POA: Diagnosis not present

## 2020-05-05 DIAGNOSIS — O3481 Maternal care for other abnormalities of pelvic organs, first trimester: Secondary | ICD-10-CM | POA: Diagnosis not present

## 2020-05-08 DIAGNOSIS — Z3689 Encounter for other specified antenatal screening: Secondary | ICD-10-CM | POA: Diagnosis not present

## 2020-05-11 ENCOUNTER — Ambulatory Visit (INDEPENDENT_AMBULATORY_CARE_PROVIDER_SITE_OTHER): Payer: Medicaid Other | Admitting: Family Medicine

## 2020-05-11 DIAGNOSIS — Z5329 Procedure and treatment not carried out because of patient's decision for other reasons: Secondary | ICD-10-CM

## 2020-05-17 ENCOUNTER — Other Ambulatory Visit: Payer: Self-pay

## 2020-05-17 ENCOUNTER — Encounter: Payer: Self-pay | Admitting: Nurse Practitioner

## 2020-05-17 ENCOUNTER — Other Ambulatory Visit (HOSPITAL_COMMUNITY): Payer: Self-pay | Admitting: Nurse Practitioner

## 2020-05-17 ENCOUNTER — Other Ambulatory Visit: Payer: Medicaid Other

## 2020-05-17 DIAGNOSIS — Z20822 Contact with and (suspected) exposure to covid-19: Secondary | ICD-10-CM

## 2020-05-17 DIAGNOSIS — U071 COVID-19: Secondary | ICD-10-CM

## 2020-05-17 NOTE — Progress Notes (Signed)
I connected by phone with Colleen Howard on 05/17/2020 at 8:13 PM to discuss the potential use of an new treatment for mild to moderate COVID-19 viral infection in non-hospitalized patients.  This patient is a 24 y.o. female that meets the FDA criteria for Emergency Use Authorization of casirivimab\imdevimab.  Has a (+) direct SARS-CoV-2 viral test result  Has mild or moderate COVID-19   Is ? 23 years of age and weighs ? 40 kg  Is NOT hospitalized due to COVID-19  Is NOT requiring oxygen therapy or requiring an increase in baseline oxygen flow rate due to COVID-19  Is within 10 days of symptom onset  Has at least one of the high risk factor(s) for progression to severe COVID-19 and/or hospitalization as defined in EUA.  Specific high risk criteria : Pregnancy   I have spoken and communicated the following to the patient or parent/caregiver:  1. FDA has authorized the emergency use of casirivimab\imdevimab for the treatment of mild to moderate COVID-19 in adults and pediatric patients with positive results of direct SARS-CoV-2 viral testing who are 53 years of age and older weighing at least 40 kg, and who are at high risk for progressing to severe COVID-19 and/or hospitalization.  2. The significant known and potential risks and benefits of casirivimab\imdevimab, and the extent to which such potential risks and benefits are unknown.  3. Information on available alternative treatments and the risks and benefits of those alternatives, including clinical trials.  4. Patients treated with casirivimab\imdevimab should continue to self-isolate and use infection control measures (e.g., wear mask, isolate, social distance, avoid sharing personal items, clean and disinfect "high touch" surfaces, and frequent handwashing) according to CDC guidelines.   5. The patient or parent/caregiver has the option to accept or refuse casirivimab\imdevimab .  After reviewing this information with the  patient, The patient agreed to proceed with receiving casirivimab\imdevimab infusion and will be provided a copy of the Fact sheet prior to receiving the infusion.[name], DNP, AGNP-C [phone] (Infusion Center Hotline)

## 2020-05-18 LAB — SARS-COV-2, NAA 2 DAY TAT

## 2020-05-18 LAB — NOVEL CORONAVIRUS, NAA: SARS-CoV-2, NAA: NOT DETECTED

## 2020-05-19 ENCOUNTER — Ambulatory Visit (HOSPITAL_COMMUNITY): Payer: Medicaid Other | Attending: Pulmonary Disease

## 2020-05-21 DIAGNOSIS — Z882 Allergy status to sulfonamides status: Secondary | ICD-10-CM | POA: Diagnosis not present

## 2020-05-21 DIAGNOSIS — Z885 Allergy status to narcotic agent status: Secondary | ICD-10-CM | POA: Diagnosis not present

## 2020-05-21 DIAGNOSIS — O219 Vomiting of pregnancy, unspecified: Secondary | ICD-10-CM | POA: Diagnosis not present

## 2020-05-21 DIAGNOSIS — Z5321 Procedure and treatment not carried out due to patient leaving prior to being seen by health care provider: Secondary | ICD-10-CM | POA: Diagnosis not present

## 2020-05-21 DIAGNOSIS — Z3A08 8 weeks gestation of pregnancy: Secondary | ICD-10-CM | POA: Diagnosis not present

## 2020-05-21 DIAGNOSIS — R109 Unspecified abdominal pain: Secondary | ICD-10-CM | POA: Diagnosis not present

## 2020-05-21 DIAGNOSIS — O26891 Other specified pregnancy related conditions, first trimester: Secondary | ICD-10-CM | POA: Diagnosis not present

## 2020-05-21 DIAGNOSIS — O210 Mild hyperemesis gravidarum: Secondary | ICD-10-CM | POA: Diagnosis not present

## 2020-05-22 ENCOUNTER — Inpatient Hospital Stay (HOSPITAL_COMMUNITY)
Admission: AD | Admit: 2020-05-22 | Discharge: 2020-05-22 | Disposition: A | Discharge status: Home / Self Care | Payer: Medicaid Other | Attending: Obstetrics and Gynecology | Admitting: Obstetrics and Gynecology

## 2020-05-22 ENCOUNTER — Encounter (HOSPITAL_COMMUNITY): Payer: Self-pay | Admitting: Obstetrics and Gynecology

## 2020-05-22 ENCOUNTER — Other Ambulatory Visit: Payer: Self-pay

## 2020-05-22 DIAGNOSIS — Z79899 Other long term (current) drug therapy: Secondary | ICD-10-CM | POA: Diagnosis not present

## 2020-05-22 DIAGNOSIS — Z87442 Personal history of urinary calculi: Secondary | ICD-10-CM | POA: Diagnosis not present

## 2020-05-22 DIAGNOSIS — O219 Vomiting of pregnancy, unspecified: Secondary | ICD-10-CM | POA: Diagnosis not present

## 2020-05-22 DIAGNOSIS — Z3A08 8 weeks gestation of pregnancy: Secondary | ICD-10-CM | POA: Insufficient documentation

## 2020-05-22 LAB — URINALYSIS, ROUTINE W REFLEX MICROSCOPIC
Bilirubin Urine: NEGATIVE
Glucose, UA: NEGATIVE mg/dL
Hgb urine dipstick: NEGATIVE
Ketones, ur: NEGATIVE mg/dL
Nitrite: NEGATIVE
Protein, ur: NEGATIVE mg/dL
Specific Gravity, Urine: 1.021 (ref 1.005–1.030)
pH: 8 (ref 5.0–8.0)

## 2020-05-22 MED ORDER — LACTATED RINGERS IV BOLUS
1000.0000 mL | Freq: Once | INTRAVENOUS | Status: AC
  Administered 2020-05-22: 1000 mL via INTRAVENOUS

## 2020-05-22 MED ORDER — ONDANSETRON HCL 4 MG PO TABS: 4.0000 mg | ORAL_TABLET | Freq: Four times a day (QID) | ORAL | 2 refills | Status: DC

## 2020-05-22 MED ORDER — PROMETHAZINE HCL 25 MG/ML IJ SOLN
25.0000 mg | Freq: Once | INTRAMUSCULAR | Status: AC
  Administered 2020-05-22: 25 mg via INTRAVENOUS
  Filled 2020-05-22: qty 1

## 2020-05-22 NOTE — MAU Provider Note (Signed)
History     CSN: [id number]  Arrival date and time: 05/22/20 1045   First Provider Initiated Contact with Patient 05/22/20 1232      Chief Complaint  Patient presents with  . Abdominal Pain  . Nausea  . Emesis   Colleen Howard is a 24 y.o. G1P0000 at [redacted]w[redacted]d who presents today with nausea and vomiting. She is seen at [facility] for prenatal care. She had an [place] with IUP noted in care everywhere.   Emesis  This is a new problem. Episode onset: approx 4 days ago. The problem occurs 2 to 4 times per day. The problem has been unchanged. The emesis has an appearance of stomach contents. There has been no fever. Associated symptoms include abdominal pain. Pertinent negatives include no diarrhea or fever. Risk factors: pregnancy  Treatments tried: a medicine given to her by her OB, but she does not remember the name.  The treatment provided no relief.    OB History    Gravida  1   Para  0   Term  0   Preterm  0   AB  0   Living  0     SAB  0   TAB  0   Ectopic  0   Multiple  0   Live Births  0           Past Medical History:  Diagnosis Date  . Kidney stone   . Migraine headache     Past Surgical History:  Procedure Laterality Date  . tubes in ear      No family history on file.  Social History   Tobacco Use  . Smoking status: Never Smoker  . Smokeless tobacco: Never Used  Vaping Use  . Vaping Use: Never used  Substance Use Topics  . Alcohol use: No  . Drug use: No    Allergies:  Allergies  Allergen Reactions  . Bactrim [Sulfamethoxazole-Trimethoprim]     hallucinations  . Codeine Itching and Rash  . Hydrocodone Rash    Medications Prior to Admission  Medication Sig Dispense Refill Last Dose  . acetaminophen (TYLENOL) 500 MG tablet Take 500 mg by mouth every 6 (six) hours as needed for mild pain or moderate pain.     Marland Kitchen dicyclomine (BENTYL) 20 MG tablet Take 1 tablet (20 mg total) by mouth 2 (two) times daily as needed for spasms  (abdominal cramping). (Patient not taking: Reported on 04/16/2020) 20 tablet 0   . hydrOXYzine (ATARAX/VISTARIL) 10 MG tablet Take 1 tablet (10 mg total) by mouth 3 (three) times daily as needed for anxiety. 30 tablet 0   . ibuprofen (ADVIL,MOTRIN) 200 MG tablet Take 200 mg by mouth every 6 (six) hours as needed for mild pain or moderate pain.     Marland Kitchen levonorgestrel-ethinyl estradiol (AVIANE) 0.1-20 MG-MCG tablet Take 1 tablet by mouth daily. 1 Package 11   . metoCLOPramide (REGLAN) 10 MG tablet Take 1 tablet (10 mg total) by mouth every 6 (six) hours as needed for nausea (nausea/headache). (Patient not taking: Reported on 04/16/2020) 6 tablet 0   . ondansetron (ZOFRAN ODT) 4 MG disintegrating tablet Take 1 tablet (4 mg total) by mouth every 8 (eight) hours as needed for nausea or vomiting. (Patient not taking: Reported on 04/16/2020) 10 tablet 0   . sertraline (ZOLOFT) 50 MG tablet Take 1/2 tab p.o. for 7 days, then take 1 tab daily. 30 tablet 0     Review of Systems  Constitutional:  Negative for diaphoresis and fever.  Gastrointestinal: Positive for abdominal pain, nausea and vomiting. Negative for constipation and diarrhea.  Genitourinary: Negative for dysuria, pelvic pain, vaginal bleeding and vaginal discharge.   Physical Exam   Blood pressure 107/69, pulse 79, temperature 98.2 F (36.8 C), temperature source Oral, resp. rate 18, height 5\' 2"  (1.575 m), weight 50.6 kg, last menstrual period 01/09/2020, SpO2 100 %.  Physical Exam Vitals and nursing note reviewed.  Constitutional:      General: She is not in acute distress. HENT:     Head: Normocephalic.  Cardiovascular:     Rate and Rhythm: Normal rate.  Pulmonary:     Effort: Pulmonary effort is normal.  Abdominal:     Tenderness: There is no abdominal tenderness.  Skin:    General: Skin is warm and dry.  Neurological:     Mental Status: She is alert and oriented to person, place, and time.  Psychiatric:        Mood and Affect:  Mood normal.        Behavior: Behavior normal.    Results for orders placed or performed during the hospital encounter of 05/22/20 (from the past 24 hour(s))  Urinalysis, Routine w reflex microscopic Urine, Clean Catch     Status: Abnormal   Collection Time: 05/22/20 11:46 AM  Result Value Ref Range   Color, Urine YELLOW YELLOW   APPearance CLOUDY (A) CLEAR   Specific Gravity, Urine 1.021 1.005 - 1.030   pH 8.0 5.0 - 8.0   Glucose, UA NEGATIVE NEGATIVE mg/dL   Hgb urine dipstick NEGATIVE NEGATIVE   Bilirubin Urine NEGATIVE NEGATIVE   Ketones, ur NEGATIVE NEGATIVE mg/dL   Protein, ur NEGATIVE NEGATIVE mg/dL   Nitrite NEGATIVE NEGATIVE   Leukocytes,Ua TRACE (A) NEGATIVE   WBC, UA 0-5 0 - 5 WBC/hpf   Bacteria, UA RARE (A) NONE SEEN   Squamous Epithelial / LPF 6-10 0 - 5   Mucus PRESENT    Amorphous Crystal PRESENT     MAU Course  Procedures  MDM Patient has had 1L or LR and 25mg  phenergan. She reports that she is feeling better. On review of records in care everywhere patient was given phenergan by her OB office. Will send home with zofran to take with phenergan to see if this helps.   Assessment and Plan   1. Nausea/vomiting in pregnancy   2. [redacted] weeks gestation of pregnancy    DC home Comfort measures reviewed  1st Trimester precautions  RX: zofran 4mg  PRN #20 with 3 RF  Return to MAU as needed or is symptoms worsen or do no improve FU with OB as planned   05/24/20 DNP, CNM  05/22/20  2:35 PM

## 2020-05-22 NOTE — MAU Note (Signed)
Colleen Howard is a 24 y.o. at [redacted]w[redacted]d here in MAU reporting: has not been able to keep anything down for the past 4 days. Emesis x 3 in the past 24 hours, felt some nausea on the way to the hospital. Also having intermittent abdominal pain. Denies vaginal bleeding or discharge.   Onset of complaint: ongoing  Pain score: 6/10  Vitals:   05/22/20 1103  BP: 104/65  Pulse: 76  Resp: 16  Temp: 98.3 F (36.8 C)  SpO2: 100%     Lab orders placed from triage: UA

## 2020-05-22 NOTE — MAU Note (Signed)
Pt complains of nausea/vomiting x4 days. She has experienced these symptoms her whole pregnancy, but states recently that it's gotten worse and her prescribed Zofran is no longer working. She states she can't keep food down. Denies LOF or VB.

## 2020-05-22 NOTE — Discharge Instructions (Signed)
Morning Sickness ° °Morning sickness is when you feel sick to your stomach (nauseous) during pregnancy. You may feel sick to your stomach and throw up (vomit). You may feel sick in the morning, but you can feel this way at any time of day. Some women feel very sick to their stomach and cannot stop throwing up (hyperemesis gravidarum). °Follow these instructions at home: °Medicines °· Take over-the-counter and prescription medicines only as told by your doctor. Do not take any medicines until you talk with your doctor about them first. °· Taking multivitamins before getting pregnant can stop or lessen the harshness of morning sickness. °Eating and drinking °· Eat dry toast or crackers before getting out of bed. °· Eat 5 or 6 small meals a day. °· Eat dry and bland foods like rice and baked potatoes. °· Do not eat greasy, fatty, or spicy foods. °· Have someone cook for you if the smell of food causes you to feel sick or throw up. °· If you feel sick to your stomach after taking prenatal vitamins, take them at night or with a snack. °· Eat protein when you need a snack. Nuts, yogurt, and cheese are good choices. °· Drink fluids throughout the day. °· Try ginger ale made with real ginger, ginger tea made from fresh grated ginger, or ginger candies. °General instructions °· Do not use any products that have nicotine or tobacco in them, such as cigarettes and e-cigarettes. If you need help quitting, ask your doctor. °· Use an air purifier to keep the air in your house free of smells. °· Get lots of fresh air. °· Try to avoid smells that make you feel sick. °· Try: °? Wearing a bracelet that is used for seasickness (acupressure wristband). °? Going to a doctor who puts thin needles into certain body points (acupuncture) to improve how you feel. °Contact a doctor if: °· You need medicine to feel better. °· You feel dizzy or light-headed. °· You are losing weight. °Get help right away if: °· You feel very sick to your  stomach and cannot stop throwing up. °· You pass out (faint). °· You have very bad pain in your belly. °Summary °· Morning sickness is when you feel sick to your stomach (nauseous) during pregnancy. °· You may feel sick in the morning, but you can feel this way at any time of day. °· Making some changes to what you eat may help your symptoms go away. °This information is not intended to replace advice given to you by your health care provider. Make sure you discuss any questions you have with your health care provider. °Document Revised: 08/28/2017 Document Reviewed: 10/16/2016 °Elsevier Patient Education © 2020 [employer] ° °

## 2020-05-30 DIAGNOSIS — Z419 Encounter for procedure for purposes other than remedying health state, unspecified: Secondary | ICD-10-CM | POA: Diagnosis not present

## 2020-06-19 DIAGNOSIS — Z3A12 12 weeks gestation of pregnancy: Secondary | ICD-10-CM | POA: Diagnosis not present

## 2020-06-19 DIAGNOSIS — Z3682 Encounter for antenatal screening for nuchal translucency: Secondary | ICD-10-CM | POA: Diagnosis not present

## 2020-06-29 DIAGNOSIS — Z419 Encounter for procedure for purposes other than remedying health state, unspecified: Secondary | ICD-10-CM | POA: Diagnosis not present

## 2020-07-09 IMAGING — DX DG CHEST 1V PORT
1 series · 1 of 1 positions shown · non-contrast
Comparison: None.

CLINICAL DATA: Cough

EXAM:
PORTABLE CHEST 1 VIEW

[chest ap]
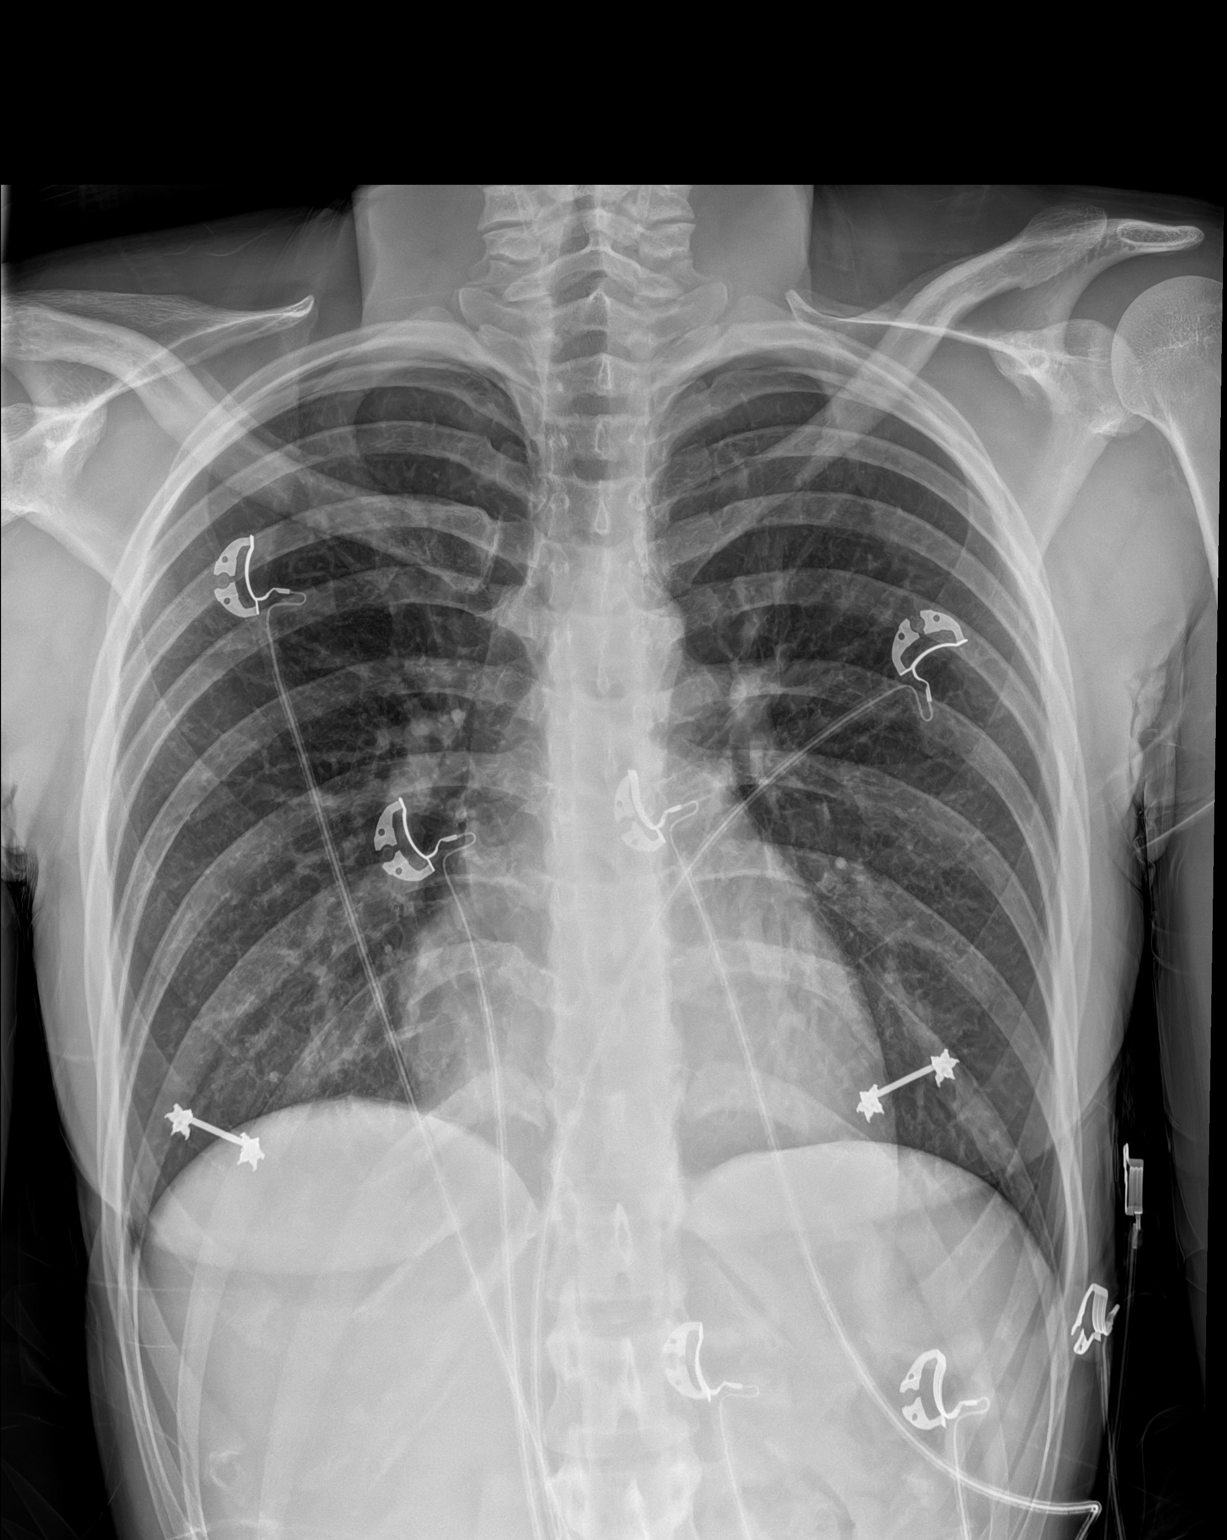

[1 of 1 positions shown; findings below may reference images not displayed]

FINDINGS: The heart size and mediastinal contours are within normal limits.
Both lungs are clear. The visualized skeletal structures are
unremarkable.
IMPRESSION: Negative.

## 2020-07-16 ENCOUNTER — Telehealth: Payer: Self-pay

## 2020-07-16 NOTE — Telephone Encounter (Signed)
Patient is requesting copy of shot records needing it today  If possible for work.

## 2020-07-16 NOTE — Telephone Encounter (Signed)
Lm advising pt shot record was at the front.

## 2020-07-17 DIAGNOSIS — Z3A16 16 weeks gestation of pregnancy: Secondary | ICD-10-CM | POA: Diagnosis not present

## 2020-07-17 DIAGNOSIS — Z23 Encounter for immunization: Secondary | ICD-10-CM | POA: Diagnosis not present

## 2020-07-17 DIAGNOSIS — Z3689 Encounter for other specified antenatal screening: Secondary | ICD-10-CM | POA: Diagnosis not present

## 2020-07-30 DIAGNOSIS — Z419 Encounter for procedure for purposes other than remedying health state, unspecified: Secondary | ICD-10-CM | POA: Diagnosis not present

## 2020-08-08 DIAGNOSIS — Z3A19 19 weeks gestation of pregnancy: Secondary | ICD-10-CM | POA: Diagnosis not present

## 2020-08-08 DIAGNOSIS — O281 Abnormal biochemical finding on antenatal screening of mother: Secondary | ICD-10-CM | POA: Diagnosis not present

## 2020-08-08 DIAGNOSIS — Z36 Encounter for antenatal screening for chromosomal anomalies: Secondary | ICD-10-CM | POA: Diagnosis not present

## 2020-08-08 DIAGNOSIS — O280 Abnormal hematological finding on antenatal screening of mother: Secondary | ICD-10-CM | POA: Diagnosis not present

## 2020-08-08 DIAGNOSIS — Z363 Encounter for antenatal screening for malformations: Secondary | ICD-10-CM | POA: Diagnosis not present

## 2020-08-29 DIAGNOSIS — Z419 Encounter for procedure for purposes other than remedying health state, unspecified: Secondary | ICD-10-CM | POA: Diagnosis not present

## 2020-09-11 DIAGNOSIS — O280 Abnormal hematological finding on antenatal screening of mother: Secondary | ICD-10-CM | POA: Diagnosis not present

## 2020-09-29 DIAGNOSIS — Z419 Encounter for procedure for purposes other than remedying health state, unspecified: Secondary | ICD-10-CM | POA: Diagnosis not present

## 2020-10-09 DIAGNOSIS — Z3A28 28 weeks gestation of pregnancy: Secondary | ICD-10-CM | POA: Diagnosis not present

## 2020-10-09 DIAGNOSIS — Z3689 Encounter for other specified antenatal screening: Secondary | ICD-10-CM | POA: Diagnosis not present

## 2020-10-09 DIAGNOSIS — O280 Abnormal hematological finding on antenatal screening of mother: Secondary | ICD-10-CM | POA: Diagnosis not present

## 2020-10-20 DIAGNOSIS — O212 Late vomiting of pregnancy: Secondary | ICD-10-CM | POA: Diagnosis not present

## 2020-10-20 DIAGNOSIS — O98519 Other viral diseases complicating pregnancy, unspecified trimester: Secondary | ICD-10-CM | POA: Diagnosis not present

## 2020-10-20 DIAGNOSIS — Z885 Allergy status to narcotic agent status: Secondary | ICD-10-CM | POA: Diagnosis not present

## 2020-10-20 DIAGNOSIS — Z882 Allergy status to sulfonamides status: Secondary | ICD-10-CM | POA: Diagnosis not present

## 2020-10-20 DIAGNOSIS — B349 Viral infection, unspecified: Secondary | ICD-10-CM | POA: Diagnosis not present

## 2020-10-20 DIAGNOSIS — O98512 Other viral diseases complicating pregnancy, second trimester: Secondary | ICD-10-CM | POA: Diagnosis not present

## 2020-10-20 DIAGNOSIS — Z3A00 Weeks of gestation of pregnancy not specified: Secondary | ICD-10-CM | POA: Diagnosis not present

## 2020-10-20 DIAGNOSIS — Z3A24 24 weeks gestation of pregnancy: Secondary | ICD-10-CM | POA: Diagnosis not present

## 2020-10-30 DIAGNOSIS — Z419 Encounter for procedure for purposes other than remedying health state, unspecified: Secondary | ICD-10-CM | POA: Diagnosis not present

## 2020-11-06 DIAGNOSIS — Z6791 Unspecified blood type, Rh negative: Secondary | ICD-10-CM | POA: Diagnosis not present

## 2020-11-06 DIAGNOSIS — O280 Abnormal hematological finding on antenatal screening of mother: Secondary | ICD-10-CM | POA: Diagnosis not present

## 2020-11-06 DIAGNOSIS — O26893 Other specified pregnancy related conditions, third trimester: Secondary | ICD-10-CM | POA: Diagnosis not present

## 2020-11-17 DIAGNOSIS — Z3A34 34 weeks gestation of pregnancy: Secondary | ICD-10-CM | POA: Diagnosis not present

## 2020-11-20 DIAGNOSIS — Z3689 Encounter for other specified antenatal screening: Secondary | ICD-10-CM | POA: Diagnosis not present

## 2020-11-20 DIAGNOSIS — R8290 Unspecified abnormal findings in urine: Secondary | ICD-10-CM | POA: Diagnosis not present

## 2020-11-27 DIAGNOSIS — Z419 Encounter for procedure for purposes other than remedying health state, unspecified: Secondary | ICD-10-CM | POA: Diagnosis not present

## 2020-12-04 DIAGNOSIS — Z3689 Encounter for other specified antenatal screening: Secondary | ICD-10-CM | POA: Diagnosis not present

## 2020-12-04 DIAGNOSIS — O280 Abnormal hematological finding on antenatal screening of mother: Secondary | ICD-10-CM | POA: Diagnosis not present

## 2020-12-24 DIAGNOSIS — Z20822 Contact with and (suspected) exposure to covid-19: Secondary | ICD-10-CM | POA: Diagnosis not present

## 2020-12-24 DIAGNOSIS — O9081 Anemia of the puerperium: Secondary | ICD-10-CM | POA: Diagnosis not present

## 2020-12-24 DIAGNOSIS — D62 Acute posthemorrhagic anemia: Secondary | ICD-10-CM | POA: Diagnosis not present

## 2020-12-24 DIAGNOSIS — Z3A39 39 weeks gestation of pregnancy: Secondary | ICD-10-CM | POA: Diagnosis not present

## 2020-12-24 DIAGNOSIS — O99824 Streptococcus B carrier state complicating childbirth: Secondary | ICD-10-CM | POA: Diagnosis not present

## 2020-12-24 DIAGNOSIS — Z6711 Type A blood, Rh negative: Secondary | ICD-10-CM | POA: Diagnosis not present

## 2020-12-24 DIAGNOSIS — O26893 Other specified pregnancy related conditions, third trimester: Secondary | ICD-10-CM | POA: Diagnosis not present

## 2020-12-27 ENCOUNTER — Telehealth: Payer: Self-pay | Admitting: *Deleted

## 2020-12-27 NOTE — Telephone Encounter (Signed)
Transition Care Management Unsuccessful Follow-up Telephone Call  Date of discharge and from where:  12/26/2020 [facility]  Attempts:  1st Attempt  Reason for unsuccessful TCM follow-up call:  Voice mail full

## 2020-12-28 DIAGNOSIS — Z419 Encounter for procedure for purposes other than remedying health state, unspecified: Secondary | ICD-10-CM | POA: Diagnosis not present

## 2020-12-28 NOTE — Telephone Encounter (Signed)
Transition Care Management Unsuccessful Follow-up Telephone Call  Date of discharge and from where:  12/26/2020 from [facility]  Attempts:  2nd Attempt  Reason for unsuccessful TCM follow-up call:  Voice mail full

## 2020-12-31 NOTE — Telephone Encounter (Signed)
Transition Care Management Unsuccessful Follow-up Telephone Call  Date of discharge and from where:  12/26/2020 [facility]  Attempts:  3rd Attempt  Reason for unsuccessful TCM follow-up call:  Voice mail full

## 2021-01-16 DIAGNOSIS — Z309 Encounter for contraceptive management, unspecified: Secondary | ICD-10-CM | POA: Diagnosis not present

## 2021-01-27 DIAGNOSIS — Z419 Encounter for procedure for purposes other than remedying health state, unspecified: Secondary | ICD-10-CM | POA: Diagnosis not present

## 2021-02-27 DIAGNOSIS — Z419 Encounter for procedure for purposes other than remedying health state, unspecified: Secondary | ICD-10-CM | POA: Diagnosis not present

## 2021-03-29 DIAGNOSIS — Z419 Encounter for procedure for purposes other than remedying health state, unspecified: Secondary | ICD-10-CM | POA: Diagnosis not present

## 2021-04-03 DIAGNOSIS — Z3042 Encounter for surveillance of injectable contraceptive: Secondary | ICD-10-CM | POA: Diagnosis not present

## 2021-04-29 DIAGNOSIS — Z419 Encounter for procedure for purposes other than remedying health state, unspecified: Secondary | ICD-10-CM | POA: Diagnosis not present

## 2021-05-30 DIAGNOSIS — Z419 Encounter for procedure for purposes other than remedying health state, unspecified: Secondary | ICD-10-CM | POA: Diagnosis not present

## 2021-06-26 DIAGNOSIS — H5213 Myopia, bilateral: Secondary | ICD-10-CM | POA: Diagnosis not present

## 2021-06-28 DIAGNOSIS — Z3042 Encounter for surveillance of injectable contraceptive: Secondary | ICD-10-CM | POA: Diagnosis not present

## 2021-06-29 DIAGNOSIS — Z419 Encounter for procedure for purposes other than remedying health state, unspecified: Secondary | ICD-10-CM | POA: Diagnosis not present

## 2021-07-30 DIAGNOSIS — Z419 Encounter for procedure for purposes other than remedying health state, unspecified: Secondary | ICD-10-CM | POA: Diagnosis not present

## 2021-08-29 DIAGNOSIS — Z419 Encounter for procedure for purposes other than remedying health state, unspecified: Secondary | ICD-10-CM | POA: Diagnosis not present

## 2021-09-27 DIAGNOSIS — Z3042 Encounter for surveillance of injectable contraceptive: Secondary | ICD-10-CM | POA: Diagnosis not present

## 2021-09-29 DIAGNOSIS — Z419 Encounter for procedure for purposes other than remedying health state, unspecified: Secondary | ICD-10-CM | POA: Diagnosis not present

## 2021-10-30 DIAGNOSIS — Z419 Encounter for procedure for purposes other than remedying health state, unspecified: Secondary | ICD-10-CM | POA: Diagnosis not present

## 2021-11-25 ENCOUNTER — Other Ambulatory Visit: Payer: Self-pay

## 2021-11-26 LAB — HCV AB W REFLEX TO QUANT PCR: HCV Ab: NONREACTIVE

## 2021-11-26 LAB — HCV INTERPRETATION

## 2021-11-26 LAB — HIV ANTIBODY (ROUTINE TESTING W REFLEX): HIV Screen 4th Generation wRfx: NONREACTIVE

## 2021-11-26 LAB — HEPATITIS B SURFACE ANTIBODY,QUALITATIVE: Hep B Surface Ab, Qual: REACTIVE

## 2021-11-27 DIAGNOSIS — Z419 Encounter for procedure for purposes other than remedying health state, unspecified: Secondary | ICD-10-CM | POA: Diagnosis not present

## 2021-12-13 DIAGNOSIS — Z3042 Encounter for surveillance of injectable contraceptive: Secondary | ICD-10-CM | POA: Diagnosis not present

## 2021-12-28 DIAGNOSIS — Z419 Encounter for procedure for purposes other than remedying health state, unspecified: Secondary | ICD-10-CM | POA: Diagnosis not present

## 2021-12-30 ENCOUNTER — Ambulatory Visit
Admission: EM | Admit: 2021-12-30 | Discharge: 2021-12-30 | Disposition: A | Discharge status: Home / Self Care | Payer: Medicaid Other

## 2021-12-30 DIAGNOSIS — J0300 Acute streptococcal tonsillitis, unspecified: Secondary | ICD-10-CM

## 2021-12-30 LAB — POCT RAPID STREP A (OFFICE): Rapid Strep A Screen: POSITIVE — AB

## 2021-12-30 MED ORDER — AMOXICILLIN 875 MG PO TABS: 875.0000 mg | ORAL_TABLET | Freq: Two times a day (BID) | ORAL | 0 refills | Status: DC

## 2021-12-30 MED ORDER — ONDANSETRON HCL 4 MG PO TABS: 4.0000 mg | ORAL_TABLET | Freq: Three times a day (TID) | ORAL | 0 refills | Status: DC | PRN

## 2021-12-30 MED ORDER — LIDOCAINE VISCOUS HCL 2 % MT SOLN: 10.0000 mL | OROMUCOSAL | 0 refills | Status: DC | PRN

## 2021-12-30 NOTE — ED Provider Notes (Signed)
?[facility] ? ? ? ?CSN: [id number] ?Arrival date & time: 12/30/21  1031 ? ? ?  ? ?History   ?Chief Complaint ?Chief Complaint  ?Patient presents with  ? Sore Throat  ?  Entered by patient  ? ? ?HPI ?Colleen Howard is a 26 y.o. female.  ? ?Presenting today with 1 day history of sore, swollen feeling throat, nausea, vomiting, difficulty swallowing.  Denies cough, congestion, chest pain, shortness of breath, difficulty breathing, diarrhea or constipation.  Taking Zofran, Tylenol with minimal temporary relief of symptoms.  No known sick contacts recently.  No known pertinent chronic medical problems. ? ? ?Past Medical History:  ?Diagnosis Date  ? Kidney stone   ? Migraine headache   ? ? ?Patient Active Problem List  ? Diagnosis Date Noted  ? Migraines 07/07/2013  ? ? ?Past Surgical History:  ?Procedure Laterality Date  ? tubes in ear    ? ?OB History   ? ? Gravida  ?1  ? Para  ?0  ? Term  ?0  ? Preterm  ?0  ? AB  ?0  ? Living  ?0  ?  ? ? SAB  ?0  ? IAB  ?0  ? Ectopic  ?0  ? Multiple  ?0  ? Live Births  ?0  ?   ?  ?  ? ?Home Medications   ? ?Prior to Admission medications   ?Medication Sig Start Date End Date Taking? Authorizing Provider  ?amoxicillin (AMOXIL) 875 MG tablet Take 1 tablet (875 mg total) by mouth 2 (two) times daily. 12/30/21  Yes Volney American, PA-C  ?lidocaine (XYLOCAINE) 2 % solution Use as directed 10 mLs in the mouth or throat every 3 (three) hours as needed for mouth pain. 12/30/21  Yes Volney American, PA-C  ?medroxyPROGESTERone Acetate 150 MG/ML SUSY INJECT 1 VIAL INTRAMUSCULARLY EVERY 3 MONTHS IN OFFICE. 12/13/21  Yes [provider]  ?acetaminophen (TYLENOL) 500 MG tablet Take 500 mg by mouth every 6 (six) hours as needed for mild pain or moderate pain.    [provider]  ?hydrOXYzine (ATARAX/VISTARIL) 10 MG tablet Take 1 tablet (10 mg total) by mouth 3 (three) times daily as needed for anxiety. 04/16/20   Elvia Collum M, DO  ?ondansetron (ZOFRAN) 4 MG  tablet Take 1 tablet (4 mg total) by mouth every 8 (eight) hours as needed for nausea or vomiting. 12/30/21   Volney American, PA-C  ?sertraline (ZOLOFT) 50 MG tablet Take 1/2 tab p.o. for 7 days, then take 1 tab daily. 04/16/20   Erven Colla, DO  ? ? ?Family History ?No family history on file. ? ?Social History ?Social History  ? ?Tobacco Use  ? Smoking status: Never  ? Smokeless tobacco: Never  ?Vaping Use  ? Vaping Use: Never used  ?Substance Use Topics  ? Alcohol use: No  ? Drug use: No  ? ? ? ?Allergies   ?Bactrim [sulfamethoxazole-trimethoprim], Codeine, and Hydrocodone ? ? ?Review of Systems ?Review of Systems ?Per HPI ? ?Physical Exam ?Triage Vital Signs ?ED Triage Vitals  ?Enc Vitals Group  ?   BP 12/30/21 1158 102/69  ?   Pulse Rate 12/30/21 1158 78  ?   Resp 12/30/21 1158 20  ?   Temp 12/30/21 1158 98.7 ?F (37.1 ?C)  ?   Temp Source 12/30/21 1158 Oral  ?   SpO2 12/30/21 1158 97 %  ?   Weight --   ?   Height --   ?  Head Circumference --   ?   Peak Flow --   ?   Pain Score 12/30/21 1155 8  ?   Pain Loc --   ?   Pain Edu? --   ?   Excl. in [place]? --   ? ?No data found. ? ?Updated Vital Signs ?BP 102/69 (BP Location: Right Arm)   Pulse 78   Temp 98.7 ?F (37.1 ?C) (Oral)   Resp 20   SpO2 97%   Breastfeeding No  ? ?Visual Acuity ?Right Eye Distance:   ?Left Eye Distance:   ?Bilateral Distance:   ? ?Right Eye Near:   ?Left Eye Near:    ?Bilateral Near:    ? ?Physical Exam ?Vitals and nursing note reviewed.  ?Constitutional:   ?   Appearance: Normal appearance.  ?HENT:  ?   Head: Atraumatic.  ?   Right Ear: Tympanic membrane and external ear normal.  ?   Left Ear: Tympanic membrane and external ear normal.  ?   Mouth/Throat:  ?   Mouth: Mucous membranes are moist.  ?   Pharynx: Oropharyngeal exudate and posterior oropharyngeal erythema present.  ?   Comments: Oral airway patent, uvula midline ?Eyes:  ?   Extraocular Movements: Extraocular movements intact.  ?   Conjunctiva/sclera: Conjunctivae  normal.  ?Cardiovascular:  ?   Rate and Rhythm: Normal rate and regular rhythm.  ?   Heart sounds: Normal heart sounds.  ?Pulmonary:  ?   Effort: Pulmonary effort is normal.  ?   Breath sounds: Normal breath sounds. No wheezing or rales.  ?Musculoskeletal:     ?   General: Normal range of motion.  ?   Cervical back: Normal range of motion and neck supple.  ?Lymphadenopathy:  ?   Cervical: Cervical adenopathy present.  ?Skin: ?   General: Skin is warm and dry.  ?Neurological:  ?   Mental Status: She is alert and oriented to person, place, and time.  ?Psychiatric:     ?   Mood and Affect: Mood normal.     ?   Thought Content: Thought content normal.  ? ? ? ?UC Treatments / Results  ?Labs ?(all labs ordered are listed, but only abnormal results are displayed) ?Labs Reviewed  ?POCT RAPID STREP A (OFFICE) - Abnormal; Notable for the following components:  ?    Result Value  ? Rapid Strep A Screen Positive (*)   ? All other components within normal limits  ? ? ?EKG ? ? ?Radiology ?No results found. ? ?Procedures ?Procedures (including critical care time) ? ?Medications Ordered in UC ?Medications - No data to display ? ?Initial Impression / Assessment and Plan / UC Course  ?I have reviewed the triage vital signs and the nursing notes. ? ?Pertinent labs & imaging results that were available during my care of the patient were reviewed by me and considered in my medical decision making (see chart for details). ? ?  ? ?Rapid strep positive, will treat with amoxicillin, viscous lidocaine, Zofran as needed for nausea and vomiting.  Discussed supportive care and return precautions.  Work note given.  Return for acutely worsening symptoms. ? ?Final Clinical Impressions(s) / UC Diagnoses  ? ?Final diagnoses:  ?Strep tonsillitis  ? ?Discharge Instructions   ?None ?  ? ?ED Prescriptions   ? ? Medication Sig Dispense Auth. Provider  ? amoxicillin (AMOXIL) 875 MG tablet Take 1 tablet (875 mg total) by mouth 2 (two) times daily. 20  tablet Volney American,  PA-C  ? lidocaine (XYLOCAINE) 2 % solution Use as directed 10 mLs in the mouth or throat every 3 (three) hours as needed for mouth pain. 100 mL Volney American, PA-C  ? ondansetron (ZOFRAN) 4 MG tablet Take 1 tablet (4 mg total) by mouth every 8 (eight) hours as needed for nausea or vomiting. 20 tablet Volney American, [place]  ? ?  ? ?PDMP not reviewed this encounter. ?  ?Volney American, PA-C ?12/30/21 1316 ? ?

## 2021-12-30 NOTE — ED Triage Notes (Signed)
Pt states that yesterday her throat started hurting ? ?Pt states she vomited x3 last night  ? ?Pt states her throat felt swollen and hurt to swallow ? ?Pt states she tried some Zofran  ? ?Denies Fever ?

## 2022-01-27 DIAGNOSIS — Z419 Encounter for procedure for purposes other than remedying health state, unspecified: Secondary | ICD-10-CM | POA: Diagnosis not present

## 2022-02-27 DIAGNOSIS — Z419 Encounter for procedure for purposes other than remedying health state, unspecified: Secondary | ICD-10-CM | POA: Diagnosis not present

## 2022-03-06 DIAGNOSIS — Z3042 Encounter for surveillance of injectable contraceptive: Secondary | ICD-10-CM | POA: Diagnosis not present

## 2022-03-20 ENCOUNTER — Ambulatory Visit (INDEPENDENT_AMBULATORY_CARE_PROVIDER_SITE_OTHER): Payer: Medicaid Other | Admitting: Family Medicine

## 2022-03-20 ENCOUNTER — Other Ambulatory Visit (HOSPITAL_COMMUNITY)
Admission: RE | Admit: 2022-03-20 | Discharge: 2022-03-20 | Disposition: A | Discharge status: Home / Self Care | Payer: Medicaid Other | Source: Ambulatory Visit | Attending: Family Medicine | Admitting: Family Medicine

## 2022-03-20 VITALS — BP 106/71 | HR 73 | Temp 98.4°F | Ht 62.0 in | Wt 133.0 lb

## 2022-03-20 DIAGNOSIS — R1084 Generalized abdominal pain: Secondary | ICD-10-CM

## 2022-03-20 DIAGNOSIS — K625 Hemorrhage of anus and rectum: Secondary | ICD-10-CM

## 2022-03-20 LAB — CBC
HCT: 44 % (ref 36.0–46.0)
Hemoglobin: 14.5 g/dL (ref 12.0–15.0)
MCH: 29.5 pg (ref 26.0–34.0)
MCHC: 33 g/dL (ref 30.0–36.0)
MCV: 89.6 fL (ref 80.0–100.0)
Platelets: 302 10*3/uL (ref 150–400)
RBC: 4.91 MIL/uL (ref 3.87–5.11)
RDW: 12.8 % (ref 11.5–15.5)
WBC: 4.8 10*3/uL (ref 4.0–10.5)
nRBC: 0 % (ref 0.0–0.2)

## 2022-03-20 LAB — COMPREHENSIVE METABOLIC PANEL
ALT: 13 U/L (ref 0–44)
AST: 16 U/L (ref 15–41)
Albumin: 4.5 g/dL (ref 3.5–5.0)
Alkaline Phosphatase: 62 U/L (ref 38–126)
Anion gap: 5 (ref 5–15)
BUN: 15 mg/dL (ref 6–20)
CO2: 25 mmol/L (ref 22–32)
Calcium: 9.5 mg/dL (ref 8.9–10.3)
Chloride: 109 mmol/L (ref 98–111)
Creatinine, Ser: 0.69 mg/dL (ref 0.44–1.00)
GFR, Estimated: >60 mL/min (ref >60)
Glucose, Bld: 89 mg/dL (ref 70–99)
Potassium: 4.4 mmol/L (ref 3.5–5.1)
Sodium: 139 mmol/L (ref 135–145)
Total Bilirubin: 0.8 mg/dL (ref 0.3–1.2)
Total Protein: 8.3 g/dL — ABNORMAL HIGH (ref 6.5–8.1)

## 2022-03-20 LAB — LIPASE, BLOOD: Lipase: 37 U/L (ref 11–51)

## 2022-03-20 NOTE — Progress Notes (Addendum)
Subjective:  Patient ID: Colleen Howard, female    DOB: 06/11/1996  Age: 26 y.o. MRN: [id number]  CC: Chief Complaint  Patient presents with   Blood In Stools    Abd pain / cramping, burning, lightheaded, nausea x 2 days     HPI:  26 year old female presents for evaluation of the above.  Patient reports that she has had frequent abdominal pain for the past 3 to 4 months.  Diffuse and crampy in nature.  She has had associated nausea.  It occurs every few days.  Over the past 2 days she has had bright red blood per rectum.  She has had associated lower abdominal pain and cramping.  She is also had lightheadedness and dizziness.  She is on Depo-Provera regarding contraception.  She states that she does not feel that she can be pregnant at this time.  She denies any rectal pain or evidence of hemorrhoids.  She has never had this before.  No family history of inflammatory bowel disease.  Pain is moderate in severity.  No relieving factors.  Patient Active Problem List   Diagnosis Date Noted   Generalized abdominal pain 03/20/2022   BRBPR (bright red blood per rectum) 03/20/2022   Migraines 07/07/2013    Social Hx   Social History   Socioeconomic History   Marital status: Single    Spouse name: Not on file   Number of children: Not on file   Years of education: Not on file   Highest education level: Not on file  Occupational History   Not on file  Tobacco Use   Smoking status: Never   Smokeless tobacco: Never  Vaping Use   Vaping Use: Never used  Substance and Sexual Activity   Alcohol use: No   Drug use: No   Sexual activity: Yes    Birth control/protection: Injection  Other Topics Concern   Not on file  Social History Narrative   Not on file   Social Determinants of Health   Financial Resource Strain: Not on file  Food Insecurity: Not on file  Transportation Needs: Not on file  Physical Activity: Not on file  Stress: Not on file  Social Connections: Not on file     Review of Systems Per HPI  Objective:  BP 106/71   Pulse 73   Temp 98.4 F (36.9 C)   Ht 5\' 2"  (1.575 m)   Wt 133 lb (60.3 kg)   SpO2 97%   BMI 24.33 kg/m      03/20/2022    8:22 AM 12/30/2021   11:58 AM 05/22/2020    2:36 PM  BP/Weight  Systolic BP 106 102 103  Diastolic BP 71 69 56  Wt. (Lbs) 133    BMI 24.33 kg/m2      Physical Exam Vitals and nursing note reviewed.  Constitutional:      General: She is not in acute distress.    Appearance: Normal appearance.  HENT:     Head: Normocephalic and atraumatic.  Eyes:     General:        Right eye: No discharge.        Left eye: No discharge.     Conjunctiva/sclera: Conjunctivae normal.  Cardiovascular:     Rate and Rhythm: Normal rate and regular rhythm.  Pulmonary:     Effort: Pulmonary effort is normal.     Breath sounds: Normal breath sounds. No wheezing, rhonchi or rales.  Abdominal:  Palpations: Abdomen is soft.     Comments: Nondistended.  Tender to palpation in the right lower quadrant as well as the left lower quadrant.  No rebound.  No guarding.  Neurological:     Mental Status: She is alert.  Psychiatric:        Mood and Affect: Mood normal.        Behavior: Behavior normal.    Lab Results  Component Value Date   WBC 4.8 03/20/2022   HGB 14.5 03/20/2022   HCT 44.0 03/20/2022   PLT 302 03/20/2022   GLUCOSE 89 03/20/2022   CHOL 142 09/15/2018   TRIG 53 09/15/2018   HDL 62 09/15/2018   LDLCALC 69 09/15/2018   ALT 13 03/20/2022   AST 16 03/20/2022   NA 139 03/20/2022   K 4.4 03/20/2022   CL 109 03/20/2022   CREATININE 0.69 03/20/2022   BUN 15 03/20/2022   CO2 25 03/20/2022     Assessment & Plan:   Problem List Items Addressed This Visit       Digestive   BRBPR (bright red blood per rectum)   Relevant Orders   CBC   CT Abdomen Pelvis W Contrast     Other   Generalized abdominal pain - Primary    Etiology and prognosis unclear at this time.  Patient with diffuse lower  abdominal tenderness on exam.  Given her physical exam findings as well as reports of bright red blood per rectum, I am arranging for stat labs.  Needs CT for further evaluation to ensure no underlying intra-abdominal pathology -mass, appendicitis, colitis.   Addendum -labs have returned and were independently reviewed by me.  CBC normal.  Metabolic panel normal.  Lipase normal.  Needs CT scan for further evaluation.  Awaiting.      Relevant Orders   Comprehensive metabolic panel   Lipase   CT Abdomen Pelvis W Contrast   Everlene Other DO [facility]

## 2022-03-20 NOTE — Assessment & Plan Note (Addendum)
Etiology and prognosis unclear at this time.  Patient with diffuse lower abdominal tenderness on exam.  Given her physical exam findings as well as reports of bright red blood per rectum, I am arranging for stat labs.  Needs CT for further evaluation to ensure no underlying intra-abdominal pathology -mass, appendicitis, colitis.   Addendum -labs have returned and were independently reviewed by me.  CBC normal.  Metabolic panel normal.  Lipase normal.  Needs CT scan for further evaluation.  Awaiting.

## 2022-03-24 ENCOUNTER — Other Ambulatory Visit: Payer: Self-pay | Admitting: Family Medicine

## 2022-03-24 ENCOUNTER — Telehealth: Payer: Self-pay | Admitting: Family Medicine

## 2022-03-24 MED ORDER — TRAMADOL HCL 50 MG PO TABS: 50.0000 mg | ORAL_TABLET | Freq: Three times a day (TID) | ORAL | 0 refills | Status: AC | PRN

## 2022-03-24 NOTE — Telephone Encounter (Signed)
LMTRC

## 2022-03-29 DIAGNOSIS — Z419 Encounter for procedure for purposes other than remedying health state, unspecified: Secondary | ICD-10-CM | POA: Diagnosis not present

## 2022-04-08 ENCOUNTER — Encounter: Payer: Self-pay | Admitting: Family Medicine

## 2022-04-08 ENCOUNTER — Telehealth: Payer: Self-pay | Admitting: Family Medicine

## 2022-04-08 NOTE — Telephone Encounter (Signed)
Nurses I am on the periphery of this issue CAT scan was denied Uncertain if insurance company gave reasons for denial? Her labs overall looked reassuring but Dr. Adriana Simas and his notes does not want to have CT scan.  Please see how patient is doing currently.  More than likely this will need to wait until Dr. Adriana Simas is back on Monday (given that the CT scan was ordered a couple weeks ago may need peer to peer to get approved or it is possible that her policy states she has to do a gastroenterology consult some more information would be beneficial to help make decisions while Dr. Adriana Simas is away)

## 2022-04-08 NOTE — Telephone Encounter (Signed)
Nurses-please see telephone message thank you

## 2022-04-09 ENCOUNTER — Other Ambulatory Visit: Payer: Self-pay | Admitting: Family Medicine

## 2022-04-09 DIAGNOSIS — R1084 Generalized abdominal pain: Secondary | ICD-10-CM

## 2022-04-09 DIAGNOSIS — K625 Hemorrhage of anus and rectum: Secondary | ICD-10-CM

## 2022-04-09 NOTE — Telephone Encounter (Signed)
Per Dr Adriana Simas: Please inform patient(patient is aware of CT denial see my chart message 04/08/22)   If she is still having trouble I am happy to refer to GI.

## 2022-04-09 NOTE — Telephone Encounter (Signed)
Referral ordered in Epic. Patient notified. 

## 2022-04-11 ENCOUNTER — Encounter: Payer: Self-pay | Admitting: *Deleted

## 2022-04-29 DIAGNOSIS — Z419 Encounter for procedure for purposes other than remedying health state, unspecified: Secondary | ICD-10-CM | POA: Diagnosis not present

## 2022-04-30 ENCOUNTER — Ambulatory Visit (INDEPENDENT_AMBULATORY_CARE_PROVIDER_SITE_OTHER): Payer: Medicaid Other | Admitting: Internal Medicine

## 2022-04-30 ENCOUNTER — Encounter: Payer: Self-pay | Admitting: Internal Medicine

## 2022-04-30 ENCOUNTER — Telehealth: Payer: Self-pay | Admitting: *Deleted

## 2022-04-30 VITALS — BP 113/78 | HR 77 | Temp 97.4°F | Ht 62.0 in | Wt 136.6 lb

## 2022-04-30 DIAGNOSIS — R195 Other fecal abnormalities: Secondary | ICD-10-CM

## 2022-04-30 DIAGNOSIS — R1032 Left lower quadrant pain: Secondary | ICD-10-CM

## 2022-04-30 DIAGNOSIS — K625 Hemorrhage of anus and rectum: Secondary | ICD-10-CM

## 2022-04-30 NOTE — Patient Instructions (Addendum)
We need to pursue a CT scan of your abdomen and pelvis to further evaluate your pain, bleeding, mucus.  We will work on getting this approved through your insurance and get this scheduled.  You may need colonoscopy in the future though we will hold off for now.  Further recommendations to follow.  It was very nice meeting you today.  Dr. Marletta Lor

## 2022-04-30 NOTE — Telephone Encounter (Signed)
PA pending review via NIA. Your tracking number is [id number].

## 2022-04-30 NOTE — Progress Notes (Signed)
Primary Care Physician:  Tommie Sams, DO Primary Gastroenterologist:  Dr. Marletta Lor  Chief Complaint  Patient presents with   Abdominal Pain    New patient. Referred for abdominal pain. States pain is a stabbing pain. Having blood in stool. Feels dizzy and lightheaded at times. Started about one month ago.     HPI:   Colleen Howard is a 26 y.o. female who presents to clinic today by referral from her PCP Dr. Adriana Simas for evaluation.  Patient was in her normal state of health until approximately 3 months ago when she had onset of abdominal pain.  Primarily left lower quadrant, right lower quadrant.  This is progressively worsened over the last month to the point where at times it is severe.  Also with associated rectal bleeding as well as mucus in her stool.  No family history of inflammatory bowel disease.  No family history of colon cancer that she is aware of.  No previous colonoscopy.  Does note some intermittent nausea as well.  Zofran helps with her nausea.  Has taken tramadol for her pain which does help some.  Had blood work in June which was largely unremarkable.  Dr. Adriana Simas ordered CT scan to further evaluate though this was denied by her insurance.  Past Medical History:  Diagnosis Date   Kidney stone    Migraine headache     Past Surgical History:  Procedure Laterality Date   tubes in ear      Current Outpatient Medications  Medication Sig Dispense Refill   medroxyPROGESTERone Acetate 150 MG/ML SUSY INJECT 1 VIAL INTRAMUSCULARLY EVERY 3 MONTHS IN OFFICE.     TRAMADOL HCL PO Take by mouth. Takes as needed for abdominal pain.     No current facility-administered medications for this visit.    Allergies as of 04/30/2022 - Review Complete 04/30/2022  Allergen Reaction Noted   Bactrim [sulfamethoxazole-trimethoprim]  06/01/2013   Codeine Itching and Rash 09/03/2011   Hydrocodone Rash 01/17/2013    History reviewed. No pertinent family history.  Social History    Socioeconomic History   Marital status: Single    Spouse name: Not on file   Number of children: Not on file   Years of education: Not on file   Highest education level: Not on file  Occupational History   Not on file  Tobacco Use   Smoking status: Never    Passive exposure: Never   Smokeless tobacco: Never  Vaping Use   Vaping Use: Never used  Substance and Sexual Activity   Alcohol use: Yes    Comment: occasional   Drug use: No   Sexual activity: Yes    Birth control/protection: Injection  Other Topics Concern   Not on file  Social History Narrative   Not on file   Social Determinants of Health   Financial Resource Strain: Not on file  Food Insecurity: Not on file  Transportation Needs: Not on file  Physical Activity: Not on file  Stress: Not on file  Social Connections: Not on file  Intimate Partner Violence: Not on file    Subjective: Review of Systems  Constitutional:  Negative for chills and fever.  HENT:  Negative for congestion and hearing loss.   Eyes:  Negative for blurred vision and double vision.  Respiratory:  Negative for cough and shortness of breath.   Cardiovascular:  Negative for chest pain and palpitations.  Gastrointestinal:  Positive for abdominal pain, blood in stool and nausea. Negative for constipation,  diarrhea, heartburn, melena and vomiting.  Genitourinary:  Negative for dysuria and urgency.  Musculoskeletal:  Negative for joint pain and myalgias.  Skin:  Negative for itching and rash.  Neurological:  Negative for dizziness and headaches.  Psychiatric/Behavioral:  Negative for depression. The patient is not nervous/anxious.        Objective: BP 113/78 (BP Location: Right Arm, Patient Position: Sitting, Cuff Size: Normal)   Pulse 77   Temp (!) 97.4 F (36.3 C) (Oral)   Ht 5\' 2"  (1.575 m)   Wt 136 lb 9.6 oz (62 kg)   BMI 24.98 kg/m  Physical Exam Constitutional:      Appearance: Normal appearance.  HENT:     Head:  Normocephalic and atraumatic.  Eyes:     Extraocular Movements: Extraocular movements intact.     Conjunctiva/sclera: Conjunctivae normal.  Cardiovascular:     Rate and Rhythm: Normal rate and regular rhythm.  Pulmonary:     Effort: Pulmonary effort is normal.     Breath sounds: Normal breath sounds.  Abdominal:     General: Bowel sounds are normal.     Palpations: Abdomen is soft.  Musculoskeletal:        General: No swelling. Normal range of motion.     Cervical back: Normal range of motion and neck supple.  Skin:    General: Skin is warm and dry.     Coloration: Skin is not jaundiced.  Neurological:     General: No focal deficit present.     Mental Status: She is alert and oriented to person, place, and time.  Psychiatric:        Mood and Affect: Mood normal.        Behavior: Behavior normal.      Assessment: *Left lower quadrant abdominal pain *Rectal bleeding *Mucus in stool *Nausea  Plan: Patient's symptoms concerning.  Differential includes diverticulitis, colitis, ileitis, underlying inflammatory bowel disease such as Crohn's disease or ulcerative colitis.  Pelvic etiology also on differential.  Needs CT abdomen pelvis with contrast ASAP to further evaluate.  This was previously denied.  We will work on getting this done.  I have already spoken with Mindy who is submitting further paperwork.  She may need colonoscopy to further evaluate though we will hold off for now.  Further recommendations to follow.  Thank you Dr. for the kind referral  04/30/2022 3:27 PM   Disclaimer: This note was dictated with voice recognition software. Similar sounding words can inadvertently be transcribed and may not be corrected upon review.

## 2022-05-01 NOTE — Telephone Encounter (Signed)
Spoke with Dr. Marletta Lor. Order abd [place] and UA.   Called pt, LMOVM to call back.

## 2022-05-01 NOTE — Telephone Encounter (Signed)
PA still pending review.

## 2022-05-01 NOTE — Telephone Encounter (Signed)
Per NIA PA denied. Needs to have  "Per NIA. Denied. Patient needs the following 1st. "Laboratory evidence suggesting the diagnosis in question (e.g. bloodwork, urine studies) Most recent Ultrasound report showing a problem, inconclusive or not explaining the symptoms"

## 2022-05-01 NOTE — Telephone Encounter (Signed)
Pt returned call. She wa agreeable.  [place] scheduled for 8/7 at 9:00am, arrival 8:45am, npo midnight.  Called pt and she is aware of appt details. She will have UA done same day.

## 2022-05-05 ENCOUNTER — Other Ambulatory Visit: Payer: Self-pay | Admitting: *Deleted

## 2022-05-05 ENCOUNTER — Ambulatory Visit (HOSPITAL_COMMUNITY)
Admission: RE | Admit: 2022-05-05 | Discharge: 2022-05-05 | Disposition: A | Discharge status: Home / Self Care | Payer: Medicaid Other | Source: Ambulatory Visit | Attending: Internal Medicine | Admitting: Internal Medicine

## 2022-05-05 DIAGNOSIS — R1032 Left lower quadrant pain: Secondary | ICD-10-CM | POA: Insufficient documentation

## 2022-05-05 DIAGNOSIS — R109 Unspecified abdominal pain: Secondary | ICD-10-CM | POA: Diagnosis not present

## 2022-05-05 DIAGNOSIS — K625 Hemorrhage of anus and rectum: Secondary | ICD-10-CM | POA: Diagnosis not present

## 2022-05-05 DIAGNOSIS — R195 Other fecal abnormalities: Secondary | ICD-10-CM | POA: Diagnosis not present

## 2022-05-05 LAB — URINALYSIS
Bilirubin Urine: NEGATIVE
Glucose, UA: NEGATIVE
Hgb urine dipstick: NEGATIVE
Ketones, ur: NEGATIVE
Nitrite: NEGATIVE
Protein, ur: NEGATIVE
Specific Gravity, Urine: 1.019 (ref 1.001–1.035)
pH: 5.5 (ref 5.0–8.0)

## 2022-05-07 ENCOUNTER — Encounter: Payer: Self-pay | Admitting: *Deleted

## 2022-05-07 NOTE — Telephone Encounter (Signed)
Clinicals submitted with US/UA results. Tracking # [id number]

## 2022-05-09 NOTE — Telephone Encounter (Signed)
PA still pending review.

## 2022-05-12 NOTE — Telephone Encounter (Signed)
PA approved. Authorization Number: [id number], DOS: April 30, 2022 to June 29, 2022

## 2022-05-12 NOTE — Telephone Encounter (Signed)
Spoke with pt. She reports she can go Thursday or Friday for CT.  CT scheduled for 8/18, arrival 9:15am, npo 4 hrs prior, p/u oral prep from radiology at least 1 day before.  Called pt back, LMOVM to call back

## 2022-05-12 NOTE — Telephone Encounter (Signed)
Checked PA and still in review

## 2022-05-13 NOTE — Telephone Encounter (Signed)
Left detailed VM with appt details

## 2022-05-16 ENCOUNTER — Ambulatory Visit (HOSPITAL_COMMUNITY)
Admission: RE | Admit: 2022-05-16 | Discharge: 2022-05-16 | Disposition: A | Discharge status: Home / Self Care | Payer: Medicaid Other | Source: Ambulatory Visit | Attending: Internal Medicine | Admitting: Internal Medicine

## 2022-05-16 DIAGNOSIS — K6389 Other specified diseases of intestine: Secondary | ICD-10-CM | POA: Diagnosis not present

## 2022-05-16 DIAGNOSIS — R1032 Left lower quadrant pain: Secondary | ICD-10-CM | POA: Diagnosis not present

## 2022-05-16 DIAGNOSIS — R195 Other fecal abnormalities: Secondary | ICD-10-CM | POA: Diagnosis not present

## 2022-05-16 DIAGNOSIS — K625 Hemorrhage of anus and rectum: Secondary | ICD-10-CM | POA: Insufficient documentation

## 2022-05-16 MED ORDER — IOHEXOL 300 MG/ML  SOLN
100.0000 mL | Freq: Once | INTRAMUSCULAR | Status: AC | PRN
  Administered 2022-05-16: 100 mL via INTRAVENOUS

## 2022-05-21 ENCOUNTER — Telehealth: Payer: Self-pay

## 2022-05-21 NOTE — Telephone Encounter (Signed)
Pt called wanting to know the results to her recent ct.

## 2022-05-22 ENCOUNTER — Encounter: Payer: Self-pay | Admitting: *Deleted

## 2022-05-22 ENCOUNTER — Other Ambulatory Visit: Payer: Self-pay | Admitting: *Deleted

## 2022-05-22 DIAGNOSIS — Z3042 Encounter for surveillance of injectable contraceptive: Secondary | ICD-10-CM | POA: Diagnosis not present

## 2022-05-22 DIAGNOSIS — R9389 Abnormal findings on diagnostic imaging of other specified body structures: Secondary | ICD-10-CM

## 2022-05-26 ENCOUNTER — Ambulatory Visit (HOSPITAL_COMMUNITY)
Admission: RE | Admit: 2022-05-26 | Discharge: 2022-05-26 | Disposition: A | Discharge status: Home / Self Care | Payer: Medicaid Other | Source: Ambulatory Visit | Attending: Internal Medicine | Admitting: Internal Medicine

## 2022-05-26 DIAGNOSIS — N83202 Unspecified ovarian cyst, left side: Secondary | ICD-10-CM | POA: Diagnosis not present

## 2022-05-26 DIAGNOSIS — R9389 Abnormal findings on diagnostic imaging of other specified body structures: Secondary | ICD-10-CM | POA: Insufficient documentation

## 2022-05-26 DIAGNOSIS — R1032 Left lower quadrant pain: Secondary | ICD-10-CM | POA: Diagnosis not present

## 2022-05-30 DIAGNOSIS — Z419 Encounter for procedure for purposes other than remedying health state, unspecified: Secondary | ICD-10-CM | POA: Diagnosis not present

## 2022-06-29 DIAGNOSIS — Z419 Encounter for procedure for purposes other than remedying health state, unspecified: Secondary | ICD-10-CM | POA: Diagnosis not present

## 2022-07-30 DIAGNOSIS — Z419 Encounter for procedure for purposes other than remedying health state, unspecified: Secondary | ICD-10-CM | POA: Diagnosis not present

## 2022-08-15 DIAGNOSIS — Z3042 Encounter for surveillance of injectable contraceptive: Secondary | ICD-10-CM | POA: Diagnosis not present

## 2022-08-29 DIAGNOSIS — Z419 Encounter for procedure for purposes other than remedying health state, unspecified: Secondary | ICD-10-CM | POA: Diagnosis not present

## 2022-09-29 DIAGNOSIS — Z419 Encounter for procedure for purposes other than remedying health state, unspecified: Secondary | ICD-10-CM | POA: Diagnosis not present

## 2022-10-08 DIAGNOSIS — Z30018 Encounter for initial prescription of other contraceptives: Secondary | ICD-10-CM | POA: Diagnosis not present

## 2022-10-14 DIAGNOSIS — S5002XA Contusion of left elbow, initial encounter: Secondary | ICD-10-CM | POA: Diagnosis not present

## 2022-10-14 DIAGNOSIS — S5012XA Contusion of left forearm, initial encounter: Secondary | ICD-10-CM | POA: Diagnosis not present

## 2022-10-14 DIAGNOSIS — M25522 Pain in left elbow: Secondary | ICD-10-CM | POA: Diagnosis not present

## 2022-10-14 DIAGNOSIS — M79632 Pain in left forearm: Secondary | ICD-10-CM | POA: Diagnosis not present

## 2022-10-30 DIAGNOSIS — Z419 Encounter for procedure for purposes other than remedying health state, unspecified: Secondary | ICD-10-CM | POA: Diagnosis not present

## 2022-11-06 DIAGNOSIS — Z30430 Encounter for insertion of intrauterine contraceptive device: Secondary | ICD-10-CM | POA: Diagnosis not present

## 2022-11-28 DIAGNOSIS — Z419 Encounter for procedure for purposes other than remedying health state, unspecified: Secondary | ICD-10-CM | POA: Diagnosis not present

## 2022-12-04 DIAGNOSIS — Z30431 Encounter for routine checking of intrauterine contraceptive device: Secondary | ICD-10-CM | POA: Diagnosis not present

## 2022-12-29 DIAGNOSIS — Z419 Encounter for procedure for purposes other than remedying health state, unspecified: Secondary | ICD-10-CM | POA: Diagnosis not present

## 2023-01-28 DIAGNOSIS — Z419 Encounter for procedure for purposes other than remedying health state, unspecified: Secondary | ICD-10-CM | POA: Diagnosis not present

## 2023-02-28 DIAGNOSIS — Z419 Encounter for procedure for purposes other than remedying health state, unspecified: Secondary | ICD-10-CM | POA: Diagnosis not present

## 2023-03-30 DIAGNOSIS — Z419 Encounter for procedure for purposes other than remedying health state, unspecified: Secondary | ICD-10-CM | POA: Diagnosis not present

## 2023-04-30 DIAGNOSIS — Z419 Encounter for procedure for purposes other than remedying health state, unspecified: Secondary | ICD-10-CM | POA: Diagnosis not present

## 2023-05-19 ENCOUNTER — Ambulatory Visit: Payer: Medicaid Other | Admitting: Nurse Practitioner

## 2023-05-19 ENCOUNTER — Encounter: Payer: Self-pay | Admitting: Nurse Practitioner

## 2023-05-19 ENCOUNTER — Ambulatory Visit (HOSPITAL_COMMUNITY)
Admission: RE | Admit: 2023-05-19 | Discharge: 2023-05-19 | Disposition: A | Discharge status: Home / Self Care | Payer: Medicaid Other | Source: Ambulatory Visit | Attending: Nurse Practitioner | Admitting: Nurse Practitioner

## 2023-05-19 VITALS — BP 106/71 | HR 68 | Temp 97.9°F | Ht 62.0 in | Wt 140.6 lb

## 2023-05-19 DIAGNOSIS — R200 Anesthesia of skin: Secondary | ICD-10-CM

## 2023-05-19 DIAGNOSIS — M62838 Other muscle spasm: Secondary | ICD-10-CM

## 2023-05-19 DIAGNOSIS — R202 Paresthesia of skin: Secondary | ICD-10-CM | POA: Insufficient documentation

## 2023-05-19 DIAGNOSIS — M542 Cervicalgia: Secondary | ICD-10-CM | POA: Insufficient documentation

## 2023-05-19 MED ORDER — DICLOFENAC SODIUM 75 MG PO TBEC: 75.0000 mg | DELAYED_RELEASE_TABLET | Freq: Two times a day (BID) | ORAL | 0 refills | Status: DC

## 2023-05-19 NOTE — Progress Notes (Signed)
Subjective:    Patient ID: Colleen Howard, female    DOB: 04/22/1996, 27 y.o.   MRN: [id number]  HPI Pt comes in today with complaints of numbness going from the back of the head down the right arm. Pt states that she began having neck pain approx a month ago but the numbness did not start until about a week ago.  States numbness comes and goes, will last about 30 minutes.  Has had it 3 times this week.  Numbness will go down to the right hand with some weakness.  No history of neck injury.  Has tried heat/ice, topicals such as IcyHot and Biofreeze and OTC NSAIDs with minimal relief.  Having migraine headaches about 3 times per month mainly on the right side.  Slight foggy vision at times but otherwise no visual disturbances.  Nausea but no vomiting.  Has some Zofran at home which helps.  Has also noted a slight blue tinge to the hand on occasion.  Has not identified any specific triggers for this.  No symptoms on the left side of the body.  No symptoms in the lower extremities. PMH migraine headaches.  Review of Systems  Respiratory:  Negative for cough, chest tightness and shortness of breath.   Cardiovascular:  Negative for chest pain.  Musculoskeletal:  Positive for neck pain.  Neurological:  Positive for weakness and numbness.       Objective:   Physical Exam NAD.  Alert, oriented.  Distinct tight tender muscles noted in the right cervical area up into the occipital area.  Tenderness also in the lateral neck area around the sternocleidomastoid.  Tight muscles noted around the trapezius and into the upper rhomboids.  Normal active ROM of the neck without limitation, minimal tenderness.  Hand and arm strength 5+ bilaterally.  Radial pulses strong.  Normal capillary refill right hand.  Color normal limit.  Lungs clear.  Heart regular rate rhythm. Today's Vitals   05/19/23 0905  BP: 106/71  Pulse: 68  Temp: 97.9 F (36.6 C)  SpO2: 98%  Weight: 140 lb 9.6 oz (63.8 kg)  Height: 5\' 2"   (1.575 m)   Body mass index is 25.72 kg/m.       Assessment & Plan:   Problem List Items Addressed This Visit       Musculoskeletal and Integument   Muscle spasms of neck   Relevant Orders   DG Cervical Spine Complete (Completed)     Other   Neck pain on right side - Primary   Relevant Orders   DG Cervical Spine Complete (Completed)   Numbness and tingling of right arm   Relevant Orders   DG Cervical Spine Complete (Completed)   Meds ordered this encounter  Medications   diclofenac (VOLTAREN) 75 MG EC tablet    Sig: Take 1 tablet (75 mg total) by mouth 2 (two) times daily. With food prn pain    Dispense:  30 tablet    Refill:  0    Order Specific Question:   Supervising Provider    Answer:   Lilyan Punt A [[id number]]   Take diclofenac as directed with food as needed.  Do not take with other OTC NSAIDs.  May take with Tylenol.  Continue ice/heat applications.  Recommend massage therapy.  Stretching exercises. Recommend patient take a picture of her hand and arm if any discoloration.  Also demonstrated with return demonstration on checking radial pulse and capillary refill. X-ray of the cervical spine pending. Warning signs  reviewed.  Recheck if symptoms worsen or persist.

## 2023-05-19 NOTE — Patient Instructions (Signed)
Massage therapy Continue ice/heat  Gentle stretching exercises of the neck

## 2023-05-31 DIAGNOSIS — Z419 Encounter for procedure for purposes other than remedying health state, unspecified: Secondary | ICD-10-CM | POA: Diagnosis not present

## 2023-06-30 DIAGNOSIS — Z419 Encounter for procedure for purposes other than remedying health state, unspecified: Secondary | ICD-10-CM | POA: Diagnosis not present

## 2023-07-02 DIAGNOSIS — N921 Excessive and frequent menstruation with irregular cycle: Secondary | ICD-10-CM | POA: Diagnosis not present

## 2023-07-02 DIAGNOSIS — Z975 Presence of (intrauterine) contraceptive device: Secondary | ICD-10-CM | POA: Diagnosis not present

## 2023-07-02 DIAGNOSIS — Z13220 Encounter for screening for lipoid disorders: Secondary | ICD-10-CM | POA: Diagnosis not present

## 2023-07-02 DIAGNOSIS — Z1329 Encounter for screening for other suspected endocrine disorder: Secondary | ICD-10-CM | POA: Diagnosis not present

## 2023-07-02 DIAGNOSIS — Z130 Encounter for screening for diseases of the blood and blood-forming organs and certain disorders involving the immune mechanism: Secondary | ICD-10-CM | POA: Diagnosis not present

## 2023-07-02 DIAGNOSIS — Z1321 Encounter for screening for nutritional disorder: Secondary | ICD-10-CM | POA: Diagnosis not present

## 2023-07-02 DIAGNOSIS — Z0001 Encounter for general adult medical examination with abnormal findings: Secondary | ICD-10-CM | POA: Diagnosis not present

## 2023-07-02 DIAGNOSIS — Z131 Encounter for screening for diabetes mellitus: Secondary | ICD-10-CM | POA: Diagnosis not present

## 2023-07-02 DIAGNOSIS — K625 Hemorrhage of anus and rectum: Secondary | ICD-10-CM | POA: Diagnosis not present

## 2023-07-03 DIAGNOSIS — Z1321 Encounter for screening for nutritional disorder: Secondary | ICD-10-CM | POA: Diagnosis not present

## 2023-07-03 DIAGNOSIS — Z130 Encounter for screening for diseases of the blood and blood-forming organs and certain disorders involving the immune mechanism: Secondary | ICD-10-CM | POA: Diagnosis not present

## 2023-07-03 DIAGNOSIS — Z13220 Encounter for screening for lipoid disorders: Secondary | ICD-10-CM | POA: Diagnosis not present

## 2023-07-03 DIAGNOSIS — Z131 Encounter for screening for diabetes mellitus: Secondary | ICD-10-CM | POA: Diagnosis not present

## 2023-07-03 DIAGNOSIS — Z1329 Encounter for screening for other suspected endocrine disorder: Secondary | ICD-10-CM | POA: Diagnosis not present

## 2023-07-06 DIAGNOSIS — N854 Malposition of uterus: Secondary | ICD-10-CM | POA: Diagnosis not present

## 2023-07-06 DIAGNOSIS — N921 Excessive and frequent menstruation with irregular cycle: Secondary | ICD-10-CM | POA: Diagnosis not present

## 2023-07-06 DIAGNOSIS — N83291 Other ovarian cyst, right side: Secondary | ICD-10-CM | POA: Diagnosis not present

## 2023-07-06 DIAGNOSIS — Z975 Presence of (intrauterine) contraceptive device: Secondary | ICD-10-CM | POA: Diagnosis not present

## 2023-07-16 DIAGNOSIS — N921 Excessive and frequent menstruation with irregular cycle: Secondary | ICD-10-CM | POA: Diagnosis not present

## 2023-07-16 DIAGNOSIS — N83202 Unspecified ovarian cyst, left side: Secondary | ICD-10-CM | POA: Diagnosis not present

## 2023-07-16 DIAGNOSIS — Z975 Presence of (intrauterine) contraceptive device: Secondary | ICD-10-CM | POA: Diagnosis not present

## 2023-07-16 DIAGNOSIS — K625 Hemorrhage of anus and rectum: Secondary | ICD-10-CM | POA: Diagnosis not present

## 2023-07-21 DIAGNOSIS — U071 COVID-19: Secondary | ICD-10-CM | POA: Diagnosis not present

## 2023-07-21 DIAGNOSIS — J101 Influenza due to other identified influenza virus with other respiratory manifestations: Secondary | ICD-10-CM | POA: Diagnosis not present

## 2023-07-21 DIAGNOSIS — R509 Fever, unspecified: Secondary | ICD-10-CM | POA: Diagnosis not present

## 2023-07-21 DIAGNOSIS — I959 Hypotension, unspecified: Secondary | ICD-10-CM | POA: Diagnosis not present

## 2023-07-21 DIAGNOSIS — Z681 Body mass index (BMI) 19 or less, adult: Secondary | ICD-10-CM | POA: Diagnosis not present

## 2023-07-21 DIAGNOSIS — J069 Acute upper respiratory infection, unspecified: Secondary | ICD-10-CM | POA: Diagnosis not present

## 2023-07-21 DIAGNOSIS — R051 Acute cough: Secondary | ICD-10-CM | POA: Diagnosis not present

## 2023-07-24 ENCOUNTER — Ambulatory Visit (INDEPENDENT_AMBULATORY_CARE_PROVIDER_SITE_OTHER): Payer: Medicaid Other | Admitting: Gastroenterology

## 2023-07-24 ENCOUNTER — Encounter: Payer: Self-pay | Admitting: Gastroenterology

## 2023-07-24 VITALS — BP 109/71 | HR 76 | Temp 98.1°F | Ht 62.0 in | Wt 141.4 lb

## 2023-07-24 DIAGNOSIS — R1084 Generalized abdominal pain: Secondary | ICD-10-CM | POA: Diagnosis not present

## 2023-07-24 DIAGNOSIS — K625 Hemorrhage of anus and rectum: Secondary | ICD-10-CM | POA: Diagnosis not present

## 2023-07-24 DIAGNOSIS — R109 Unspecified abdominal pain: Secondary | ICD-10-CM

## 2023-07-24 MED ORDER — TRULANCE 3 MG PO TABS: 3.0000 mg | ORAL_TABLET | Freq: Every day | ORAL | 5 refills | Status: DC

## 2023-07-24 NOTE — Progress Notes (Signed)
GI Office Note    Referring Provider: Tommie Sams, DO Primary Care Physician:  Tommie Sams, DO  Primary Gastroenterologist: Hennie Duos. Marletta Lor, DO   Chief Complaint   Chief Complaint  Patient presents with   Rectal Bleeding    History of Present Illness   Colleen Howard is a 27 y.o. female presenting today for follow-up.  Last seen in August 2023, seen at that time for LLQ pain and rectal bleeding. Referred by her gynecologist Quentin Mulling for further evaluation of rectal bleeding.  At that time she had abdominal ultrasound which was unremarkable except for possible fatty liver.  CT abdomen pelvis with contrast noted to have soft tissue density lesion in the left adnexa measuring 3.2 cm possibly a hemorrhagic cyst, prominent vessel adjacent to the left rectal wall which could be hemorrhoids, thickening of the loop of small bowel in the left upper quadrant which could reflect mild enteritis.  Pelvic ultrasound completed showing complicated likely hemorrhagic cyst of the left ovary 3.5 cm in size with slight irregularity posteriorly, advising 6 to 12-week follow-up study.  She was advised to follow-up with her GYN for this, most recent u/s with them earlier this month.   Labs on July 03, 2023: White blood cell count 5600, hemoglobin 14.6, platelets 267,000, sodium 138, potassium 4, creatinine 0.73, albumin 3.9, total bilirubin 0.9, alk phos 83, AST 14, ALT 19, A1c 4.9,TSH 1.170.  Today:  Still having a lot of bloating and abdominal discomfort. Gyn does not think her ovarian cyst is the source. BMs couple of times per week. Stools not hard but can be small balls. No straining. Sometimes uses stool softeners. Does not feel relief of abdominal bloating and discomfort with BM. Occasional nocturnal abdominal pain. Sometimes worse with meals. Is not aware of any specific food triggers. No heartburn on regular basis. No dysphagia. Some nausea with abdominal pain. No weight loss.     Medications   Current Outpatient Medications  Medication Sig Dispense Refill   levonorgestrel (MIRENA, 52 MG,) 20 MCG/DAY IUD TO BE INSERTED ONE TIME BY PRESCRIBER. ROUTE INTRAUTERINE.     No current facility-administered medications for this visit.    Allergies   Allergies as of 07/24/2023 - Review Complete 07/24/2023  Allergen Reaction Noted   Bactrim [sulfamethoxazole-trimethoprim]  06/01/2013   Codeine Itching and Rash 09/03/2011   Hydrocodone Rash 01/17/2013     Past Medical History   Past Medical History:  Diagnosis Date   Kidney stone    Migraine headache     Past Surgical History   Past Surgical History:  Procedure Laterality Date   tubes in ear      Past Family History   Family History  Problem Relation Age of Onset   Thyroid disease Mother     Past Social History   Social History   Socioeconomic History   Marital status: Single    Spouse name: Not on file   Number of children: Not on file   Years of education: Not on file   Highest education level: Not on file  Occupational History   Not on file  Tobacco Use   Smoking status: Never    Passive exposure: Never   Smokeless tobacco: Never  Vaping Use   Vaping status: Never Used  Substance and Sexual Activity   Alcohol use: Yes    Comment: occasional   Drug use: No   Sexual activity: Yes    Birth control/protection: I.U.D.  Other  Topics Concern   Not on file  Social History Narrative   Not on file   Social Determinants of Health   Financial Resource Strain: Not on file  Food Insecurity: Not on file  Transportation Needs: Not on file  Physical Activity: Insufficiently Active (05/08/2020)   Received from [facility], [facility]   Exercise Vital Sign    Days of Exercise per Week: 3 days    Minutes of Exercise per Session: 30 min  Stress: Not on file  Social Connections: Not on file  Intimate Partner Violence: Not At Risk (07/02/2023)   Received from [facility]    Humiliation, Afraid, Rape, and Kick questionnaire    Fear of Current or Ex-Partner: No    Emotionally Abused: No    Physically Abused: No    Sexually Abused: No    Review of Systems   General: Negative for anorexia, weight loss, fever, chills, fatigue, weakness. ENT: Negative for hoarseness, difficulty swallowing , nasal congestion. CV: Negative for chest pain, angina, palpitations, dyspnea on exertion, peripheral edema.  Respiratory: Negative for dyspnea at rest, dyspnea on exertion, cough, sputum, wheezing.  GI: See history of present illness. GU:  Negative for dysuria, hematuria, urinary incontinence, urinary frequency, nocturnal urination.  Endo: Negative for unusual weight change.     Physical Exam   BP 109/71 (BP Location: Right Arm, Patient Position: Sitting, Cuff Size: Normal)   Pulse 76   Temp 98.1 F (36.7 C) (Oral)   Ht 5\' 2"  (1.575 m)   Wt 141 lb 6.4 oz (64.1 kg)   LMP  (LMP Unknown)   SpO2 97%   BMI 25.86 kg/m    General: Well-nourished, well-developed in no acute distress.  Eyes: No icterus. Mouth: Oropharyngeal mucosa moist and pink , no lesions erythema or exudate. Lungs: Clear to auscultation bilaterally.  Heart: Regular rate and rhythm, no murmurs rubs or gallops.  Abdomen: Bowel sounds are normal,  no hepatosplenomegaly or masses,  no abdominal bruits or hernia , no rebound or guarding. Mild distention. Mild upper abdominal tenderness, mid and luq. Rectal: not performed  Extremities: No lower extremity edema. No clubbing or deformities. Neuro: Alert and oriented x 4   Skin: Warm and dry, no jaundice.   Psych: Alert and cooperative, normal mood and affect.  Labs   See hpi  Imaging Studies   No results found.  Assessment/Plan:   Abdominal pain/rectal bleeding -prior 2023 imaging with prominent vein along side rectum, ?hemorrhoid -some thickening of loop of small bowel in left upper quadrant last year, ?enteritis, unclear significance -denies  constipation/hard stools but stools are infrequent, ?IBS-C contributing to her symptoms.  -rectal bleeding may be due to hemorrhoid or other benign anorectal source but given chronicity would recommend further evaluation -check celiac serologies, esr, crp -colonoscopy with Dr. Marletta Lor. ASA 1.  I have discussed the risks, alternatives, benefits with regards to but not limited to the risk of reaction to medication, bleeding, infection, perforation and the patient is agreeable to proceed. Written consent to be obtained. -start Trulance 3mg  daily    Leanna Battles. Melvyn Neth, MHS, PA-C [facility]

## 2023-07-24 NOTE — Patient Instructions (Signed)
Start Trulance 3 mg daily to improve bowel function.  Please complete lab work at [facility]. Colonoscopy to be scheduled.

## 2023-07-27 ENCOUNTER — Telehealth: Payer: Self-pay | Admitting: *Deleted

## 2023-07-27 LAB — SEDIMENTATION RATE: Sed Rate: 14 mm/h (ref 0–32)

## 2023-07-27 LAB — IGA: IgA/Immunoglobulin A, Serum: 233 mg/dL (ref 87–352)

## 2023-07-27 LAB — C-REACTIVE PROTEIN: CRP: 36 mg/L — ABNORMAL HIGH (ref 0–10)

## 2023-07-27 LAB — TISSUE TRANSGLUTAMINASE, IGA: Transglutaminase IgA: <2 U/mL (ref 0–3)

## 2023-07-27 NOTE — Telephone Encounter (Signed)
LMOVM to return call   TCS w/Dr.Carver, ASA 1

## 2023-07-28 ENCOUNTER — Encounter: Payer: Self-pay | Admitting: *Deleted

## 2023-07-28 ENCOUNTER — Telehealth: Payer: Self-pay

## 2023-07-28 ENCOUNTER — Other Ambulatory Visit: Payer: Self-pay | Admitting: *Deleted

## 2023-07-28 ENCOUNTER — Telehealth: Payer: Self-pay | Admitting: Internal Medicine

## 2023-07-28 DIAGNOSIS — K625 Hemorrhage of anus and rectum: Secondary | ICD-10-CM

## 2023-07-28 DIAGNOSIS — R1084 Generalized abdominal pain: Secondary | ICD-10-CM

## 2023-07-28 MED ORDER — LINACLOTIDE 145 MCG PO CAPS: 145.0000 ug | ORAL_CAPSULE | Freq: Every day | ORAL | 5 refills | Status: AC

## 2023-07-28 NOTE — Telephone Encounter (Signed)
Sent in rx for linzess. Please let pt know.

## 2023-07-28 NOTE — Telephone Encounter (Signed)
See previous TE

## 2023-07-28 NOTE — Telephone Encounter (Signed)
Colleen Howard, the patient returned your call by leaving a message on the front desk voice mail.  [phone]

## 2023-07-28 NOTE — Telephone Encounter (Signed)
Pt's insurance denied Trulance. Pt will need to try and fail amitiza or linzess.

## 2023-07-28 NOTE — Telephone Encounter (Signed)
Pt has been scheduled for 08/25/23. Instructions and sample of prep placed up front.

## 2023-07-28 NOTE — Addendum Note (Signed)
Addended by: Tiffany Kocher on: 07/28/2023 12:58 PM   Modules accepted: Orders

## 2023-07-29 NOTE — Telephone Encounter (Signed)
Pt was made aware and verbalized understanding.  

## 2023-07-29 NOTE — Telephone Encounter (Signed)
Lmom for return call.  

## 2023-07-31 DIAGNOSIS — Z419 Encounter for procedure for purposes other than remedying health state, unspecified: Secondary | ICD-10-CM | POA: Diagnosis not present

## 2023-08-21 DIAGNOSIS — K625 Hemorrhage of anus and rectum: Secondary | ICD-10-CM | POA: Diagnosis not present

## 2023-08-21 DIAGNOSIS — R1084 Generalized abdominal pain: Secondary | ICD-10-CM | POA: Diagnosis not present

## 2023-08-21 NOTE — Telephone Encounter (Signed)
Spoke to pt and she had come by to pick up instructions and prep. Just wanted to know what time we closed today but was able to come by before office closed.

## 2023-08-21 NOTE — Telephone Encounter (Signed)
Pt left vm stating she was supposed to come pick up instructions and prep today. She says she had a few questions.  LMTRC

## 2023-08-22 LAB — PREGNANCY, URINE: Preg Test, Ur: NEGATIVE

## 2023-08-25 ENCOUNTER — Ambulatory Visit (HOSPITAL_COMMUNITY): Payer: Medicaid Other | Admitting: Anesthesiology

## 2023-08-25 ENCOUNTER — Ambulatory Visit (HOSPITAL_COMMUNITY)
Admission: RE | Admit: 2023-08-25 | Discharge: 2023-08-25 | Disposition: A | Discharge status: Home / Self Care | Payer: Medicaid Other | Attending: Internal Medicine | Admitting: Internal Medicine

## 2023-08-25 ENCOUNTER — Other Ambulatory Visit: Payer: Self-pay

## 2023-08-25 ENCOUNTER — Encounter (HOSPITAL_COMMUNITY)
Admission: RE | Disposition: A | Discharge status: Home / Self Care | Payer: Self-pay | Source: Home / Self Care | Attending: Internal Medicine

## 2023-08-25 ENCOUNTER — Encounter (HOSPITAL_COMMUNITY): Payer: Self-pay

## 2023-08-25 DIAGNOSIS — K648 Other hemorrhoids: Secondary | ICD-10-CM | POA: Insufficient documentation

## 2023-08-25 DIAGNOSIS — K625 Hemorrhage of anus and rectum: Secondary | ICD-10-CM | POA: Insufficient documentation

## 2023-08-25 DIAGNOSIS — Z793 Long term (current) use of hormonal contraceptives: Secondary | ICD-10-CM | POA: Insufficient documentation

## 2023-08-25 DIAGNOSIS — R519 Headache, unspecified: Secondary | ICD-10-CM | POA: Insufficient documentation

## 2023-08-25 DIAGNOSIS — Z79899 Other long term (current) drug therapy: Secondary | ICD-10-CM | POA: Insufficient documentation

## 2023-08-25 DIAGNOSIS — Z800 Family history of malignant neoplasm of digestive organs: Secondary | ICD-10-CM | POA: Diagnosis not present

## 2023-08-25 HISTORY — DX: Personal history of urinary calculi: Z87.442

## 2023-08-25 HISTORY — PX: COLONOSCOPY WITH PROPOFOL: SHX5780

## 2023-08-25 SURGERY — COLONOSCOPY WITH PROPOFOL
Anesthesia: General

## 2023-08-25 MED ORDER — PROPOFOL 500 MG/50ML IV EMUL
INTRAVENOUS | Status: AC
  Filled 2023-08-25: qty 100

## 2023-08-25 MED ORDER — LACTATED RINGERS IV SOLN: INTRAVENOUS | Status: DC | PRN

## 2023-08-25 MED ORDER — PROPOFOL 500 MG/50ML IV EMUL
INTRAVENOUS | Status: AC
Start: 2023-08-25 — End: ?
  Filled 2023-08-25: qty 50

## 2023-08-25 MED ORDER — GLYCOPYRROLATE PF 0.2 MG/ML IJ SOSY
PREFILLED_SYRINGE | INTRAMUSCULAR | Status: DC | PRN
  Administered 2023-08-25: .2 mg via INTRAVENOUS

## 2023-08-25 MED ORDER — GLYCOPYRROLATE PF 0.2 MG/ML IJ SOSY: PREFILLED_SYRINGE | INTRAMUSCULAR | Status: DC | PRN

## 2023-08-25 MED ORDER — PROPOFOL 500 MG/50ML IV EMUL
INTRAVENOUS | Status: DC | PRN
  Administered 2023-08-25: 200 ug/kg/min via INTRAVENOUS

## 2023-08-25 MED ORDER — LIDOCAINE HCL (PF) 2 % IJ SOLN
INTRAMUSCULAR | Status: DC | PRN
  Administered 2023-08-25: 100 mg via INTRADERMAL

## 2023-08-25 NOTE — Anesthesia Preprocedure Evaluation (Addendum)
Anesthesia Evaluation  Patient identified by MRN, date of birth, ID band Patient awake    Reviewed: Allergy & Precautions, H&P , NPO status , Patient's Chart, lab work & pertinent test results, reviewed documented beta blocker date and time   Airway Mallampati: II  TM Distance: >3 FB Neck ROM: full    Dental no notable dental hx. (+) Dental Advisory Given, Teeth Intact   Pulmonary neg pulmonary ROS   Pulmonary exam normal breath sounds clear to auscultation       Cardiovascular Exercise Tolerance: Good negative cardio ROS Normal cardiovascular exam Rhythm:regular Rate:Normal     Neuro/Psych  Headaches  negative psych ROS   GI/Hepatic negative GI ROS, Neg liver ROS,,,  Endo/Other  negative endocrine ROS    Renal/GU negative Renal ROS  negative genitourinary   Musculoskeletal   Abdominal   Peds  Hematology negative hematology ROS (+)   Anesthesia Other Findings   Reproductive/Obstetrics negative OB ROS                             Anesthesia Physical Anesthesia Plan  ASA: 2  Anesthesia Plan: General   Post-op Pain Management: Minimal or no pain anticipated   Induction: Intravenous  PONV Risk Score and Plan: Propofol infusion  Airway Management Planned: Nasal Cannula and Natural Airway  Additional Equipment: None  Intra-op Plan:   Post-operative Plan:   Informed Consent: I have reviewed the patients History and Physical, chart, labs and discussed the procedure including the risks, benefits and alternatives for the proposed anesthesia with the patient or authorized representative who has indicated his/her understanding and acceptance.     Dental Advisory Given  Plan Discussed with: CRNA  Anesthesia Plan Comments:         Anesthesia Quick Evaluation

## 2023-08-25 NOTE — Anesthesia Postprocedure Evaluation (Signed)
Anesthesia Post Note  Patient: Colleen Howard  Procedure(s) Performed: COLONOSCOPY WITH PROPOFOL  Patient location during evaluation: PACU Anesthesia Type: General Level of consciousness: awake and alert Pain management: pain level controlled Vital Signs Assessment: post-procedure vital signs reviewed and stable Respiratory status: spontaneous breathing, nonlabored ventilation, respiratory function stable and patient connected to nasal cannula oxygen Cardiovascular status: blood pressure returned to baseline and stable Postop Assessment: no apparent nausea or vomiting Anesthetic complications: no   There were no known notable events for this encounter.   Last Vitals:  Vitals:   08/25/23 1205 08/25/23 1258  BP: 110/60 (!) 93/45  Pulse: 77 93  Resp: 18 17  Temp: 36.4 C (!) 36.3 C  SpO2: 97% 97%    Last Pain:  Vitals:   08/25/23 1258  TempSrc: Oral  PainSc: 0-No pain                 Jakalyn Kratky L Raiyah Speakman

## 2023-08-25 NOTE — Discharge Instructions (Addendum)
  Colonoscopy Discharge Instructions  Read the instructions outlined below and refer to this sheet in the next few weeks. These discharge instructions provide you with general information on caring for yourself after you leave the hospital. Your doctor may also give you specific instructions. While your treatment has been planned according to the most current medical practices available, unavoidable complications occasionally occur.   ACTIVITY You may resume your regular activity, but move at a slower pace for the next 24 hours.  Take frequent rest periods for the next 24 hours.  Walking will help get rid of the air and reduce the bloated feeling in your belly (abdomen).  No driving for 24 hours (because of the medicine (anesthesia) used during the test).   Do not sign any important legal documents or operate any machinery for 24 hours (because of the anesthesia used during the test).  NUTRITION Drink plenty of fluids.  You may resume your normal diet as instructed by your doctor.  Begin with a light meal and progress to your normal diet. Heavy or fried foods are harder to digest and may make you feel sick to your stomach (nauseated).  Avoid alcoholic beverages for 24 hours or as instructed.  MEDICATIONS You may resume your normal medications unless your doctor tells you otherwise.  WHAT YOU CAN EXPECT TODAY Some feelings of bloating in the abdomen.  Passage of more gas than usual.  Spotting of blood in your stool or on the toilet paper.  IF YOU HAD POLYPS REMOVED DURING THE COLONOSCOPY: No aspirin products for 7 days or as instructed.  No alcohol for 7 days or as instructed.  Eat a soft diet for the next 24 hours.  FINDING OUT THE RESULTS OF YOUR TEST Not all test results are available during your visit. If your test results are not back during the visit, make an appointment with your caregiver to find out the results. Do not assume everything is normal if you have not heard from your  caregiver or the medical facility. It is important for you to follow up on all of your test results.  SEEK IMMEDIATE MEDICAL ATTENTION IF: You have more than a spotting of blood in your stool.  Your belly is swollen (abdominal distention).  You are nauseated or vomiting.  You have a temperature over 101.  You have abdominal pain or discomfort that is severe or gets worse throughout the day.   Overall, your colon appeared very healthy.  I did not see any active inflammation indicative of underlying inflammatory bowel disease such as Crohn's disease or ulcerative colitis throughout your colon or end portion of your small bowel.   I did not find any polyps or evidence of colon cancer.  Recommend repeat colonoscopy at age 27 for colon cancer screening purposes.  You do have internal hemorrhoids which is likely the cause of your bleeding.  If this becomes more frequent we can consider hemorrhoid banding in the office.  Follow-up in GI clinic in 2 to 3 months.  I hope you have a great rest of your week!  Hennie Duos. Marletta Lor, D.O. Gastroenterology and Hepatology [facility]

## 2023-08-25 NOTE — H&P (Signed)
Primary Care Physician:  Tommie Sams, DO Primary Gastroenterologist:  Dr. Marletta Lor  Pre-Procedure History & Physical: HPI:  Colleen Howard is a 27 y.o. female is here for a colonoscopy for  rectal bleeding, abdominal pain  Past Medical History:  Diagnosis Date   History of kidney stones    Kidney stone    Migraine headache     Past Surgical History:  Procedure Laterality Date   tubes in ear     WISDOM TOOTH EXTRACTION      Prior to Admission medications   Medication Sig Start Date End Date Taking? Authorizing Provider  linaclotide [facility]) 145 MCG CAPS capsule Take 1 capsule (145 mcg total) by mouth daily before breakfast. 07/28/23  Yes Tiffany Kocher, PA-C  levonorgestrel (MIRENA, 52 MG,) 20 MCG/DAY IUD TO BE INSERTED ONE TIME BY PRESCRIBER. ROUTE INTRAUTERINE. 10/14/22   [provider]    Allergies as of 07/28/2023 - Review Complete 07/24/2023  Allergen Reaction Noted   Bactrim [sulfamethoxazole-trimethoprim]  06/01/2013   Codeine Itching and Rash 09/03/2011   Hydrocodone Rash 01/17/2013    Family History  Problem Relation Age of Onset   Thyroid disease Mother    Cancer - Colon Other        maternal great uncle   Celiac disease Neg Hx    Inflammatory bowel disease Neg Hx     Social History   Socioeconomic History   Marital status: Single    Spouse name: Not on file   Number of children: Not on file   Years of education: Not on file   Highest education level: Not on file  Occupational History   Not on file  Tobacco Use   Smoking status: Never    Passive exposure: Never   Smokeless tobacco: Never  Vaping Use   Vaping status: Never Used  Substance and Sexual Activity   Alcohol use: Yes    Comment: occasional   Drug use: No   Sexual activity: Yes    Birth control/protection: I.U.D.  Other Topics Concern   Not on file  Social History Narrative   Not on file   Social Determinants of Health   Financial Resource Strain: Not on file  Food  Insecurity: Not on file  Transportation Needs: Not on file  Physical Activity: Insufficiently Active (05/08/2020)   Received from [facility], [facility]   Exercise Vital Sign    Days of Exercise per Week: 3 days    Minutes of Exercise per Session: 30 min  Stress: Not on file  Social Connections: Not on file  Intimate Partner Violence: Not At Risk (07/02/2023)   Received from [facility]   Humiliation, Afraid, Rape, and Kick questionnaire    Fear of Current or Ex-Partner: No    Emotionally Abused: No    Physically Abused: No    Sexually Abused: No    Review of Systems: See HPI, otherwise negative ROS  Physical Exam: Vital signs in last 24 hours:     General:   Alert,  Well-developed, well-nourished, pleasant and cooperative in NAD Head:  Normocephalic and atraumatic. Eyes:  Sclera clear, no icterus.   Conjunctiva pink. Ears:  Normal auditory acuity. Nose:  No deformity, discharge,  or lesions. Msk:  Symmetrical without gross deformities. Normal posture. Extremities:  Without clubbing or edema. Neurologic:  Alert and  oriented x4;  grossly normal neurologically. Skin:  Intact without significant lesions or rashes. Psych:  Alert and cooperative. Normal mood and affect.  Impression/Plan: Colleen Howard is here for a colonoscopy to be performed for rectal bleeding, abdominal pain  The risks of the procedure including infection, bleed, or perforation as well as benefits, limitations, alternatives and imponderables have been reviewed with the patient. Questions have been answered. All parties agreeable.

## 2023-08-25 NOTE — Transfer of Care (Signed)
Immediate Anesthesia Transfer of Care Note  Patient: Colleen Howard  Procedure(s) Performed: COLONOSCOPY WITH PROPOFOL  Patient Location: Endoscopy Unit  Anesthesia Type:General  Level of Consciousness: drowsy  Airway & Oxygen Therapy: Patient Spontanous Breathing  Post-op Assessment: Report given to RN and Post -op Vital signs reviewed and stable  Post vital signs: Reviewed and stable  Last Vitals:  Vitals Value Taken Time  BP 93/45 08/25/23 1258  Temp 36.3 C 08/25/23 1258  Pulse 93 08/25/23 1258  Resp 17 08/25/23 1258  SpO2 97 % 08/25/23 1258    Last Pain:  Vitals:   08/25/23 1258  TempSrc: Oral  PainSc: 0-No pain      Patients Stated Pain Goal: 10 (08/25/23 1205)  Complications: No notable events documented.

## 2023-08-25 NOTE — Op Note (Signed)
[facility] Patient Name: Colleen Howard Procedure Date: 08/25/2023 12:35 PM MRN: [id number] Date of Birth: [birth year] Attending MD: Hennie Duos. Marletta Lor , [place], [id number] CSN: [id number] Age: 27 Admit Type: Outpatient Procedure:                Colonoscopy Indications:              Rectal bleeding Providers:                Hennie Duos. Marletta Lor, DO, Crystal Page, Lennice Sites                            Technician, Technician Referring MD:              Medicines:                See the Anesthesia note for documentation of the                            administered medications Complications:            No immediate complications. Estimated Blood Loss:     Estimated blood loss: none. Procedure:                Pre-Anesthesia Assessment:                           - The anesthesia plan was to use monitored                            anesthesia care (MAC).                           After obtaining informed consent, the colonoscope                            was passed under direct vision. Throughout the                            procedure, the patient's blood pressure, pulse, and                            oxygen saturations were monitored continuously. The                            PCF-HQ190L ([id number]) scope was introduced through                            the anus and advanced to the the terminal ileum,                            with identification of the appendiceal orifice and                            IC valve. The colonoscopy was performed without                            difficulty. The patient tolerated the procedure  well. The quality of the bowel preparation was                            evaluated using the BBPS [facility] Bowel Preparation                            Scale) with scores of: Right Colon = 3, Transverse                            Colon = 3 and Left Colon = 3 (entire mucosa seen                            well with no residual staining,  small fragments of                            stool or opaque liquid). The total BBPS score                            equals 9. Scope In: 12:44:23 PM Scope Out: 12:55:10 PM Scope Withdrawal Time: 0 hours 8 minutes 30 seconds  Total Procedure Duration: 0 hours 10 minutes 47 seconds  Findings:      Non-bleeding internal hemorrhoids were found during retroflexion.      The terminal ileum appeared normal.      The colon (entire examined portion) appeared normal. Impression:               - Non-bleeding internal hemorrhoids.                           - The examined portion of the ileum was normal.                           - The entire examined colon is normal.                           - No specimens collected. Moderate Sedation:      Per Anesthesia Care Recommendation:           - Patient has a contact number available for                            emergencies. The signs and symptoms of potential                            delayed complications were discussed with the                            patient. Return to normal activities tomorrow.                            Written discharge instructions were provided to the                            patient.                           -  Resume previous diet.                           - Continue present medications.                           - Repeat colonoscopy at age 26 for screening                            purposes.                           - Return to GI clinic in 3 months. Procedure Code(s):        --- Professional ---                           [phone], Colonoscopy, flexible; diagnostic, including                            collection of specimen(s) by brushing or washing,                            when performed (separate procedure) Diagnosis Code(s):        --- Professional ---                           K64.8, Other hemorrhoids                           K62.5, Hemorrhage of anus and rectum CPT copyright 2022 [employer]  Association. All rights reserved. The codes documented in this report are preliminary and upon coder review may  be revised to meet current compliance requirements. Hennie Duos. Marletta Lor, DO Hennie Duos. Marletta Lor, DO 08/25/2023 1:00:09 PM This report has been signed electronically. Number of Addenda: 0

## 2023-08-30 DIAGNOSIS — Z419 Encounter for procedure for purposes other than remedying health state, unspecified: Secondary | ICD-10-CM | POA: Diagnosis not present

## 2023-09-01 ENCOUNTER — Encounter (HOSPITAL_COMMUNITY): Payer: Self-pay | Admitting: Internal Medicine

## 2023-09-30 DIAGNOSIS — Z419 Encounter for procedure for purposes other than remedying health state, unspecified: Secondary | ICD-10-CM | POA: Diagnosis not present

## 2023-10-01 DIAGNOSIS — N83202 Unspecified ovarian cyst, left side: Secondary | ICD-10-CM | POA: Diagnosis not present

## 2023-10-05 DIAGNOSIS — L239 Allergic contact dermatitis, unspecified cause: Secondary | ICD-10-CM | POA: Diagnosis not present

## 2023-10-08 DIAGNOSIS — Z202 Contact with and (suspected) exposure to infections with a predominantly sexual mode of transmission: Secondary | ICD-10-CM | POA: Diagnosis not present

## 2023-10-14 ENCOUNTER — Encounter: Payer: Self-pay | Admitting: Internal Medicine

## 2023-10-31 DIAGNOSIS — Z419 Encounter for procedure for purposes other than remedying health state, unspecified: Secondary | ICD-10-CM | POA: Diagnosis not present

## 2023-11-28 DIAGNOSIS — Z419 Encounter for procedure for purposes other than remedying health state, unspecified: Secondary | ICD-10-CM | POA: Diagnosis not present

## 2024-01-09 DIAGNOSIS — Z419 Encounter for procedure for purposes other than remedying health state, unspecified: Secondary | ICD-10-CM | POA: Diagnosis not present

## 2024-02-08 DIAGNOSIS — Z419 Encounter for procedure for purposes other than remedying health state, unspecified: Secondary | ICD-10-CM | POA: Diagnosis not present

## 2024-03-10 DIAGNOSIS — Z419 Encounter for procedure for purposes other than remedying health state, unspecified: Secondary | ICD-10-CM | POA: Diagnosis not present

## 2024-04-09 DIAGNOSIS — Z419 Encounter for procedure for purposes other than remedying health state, unspecified: Secondary | ICD-10-CM | POA: Diagnosis not present

## 2024-05-10 DIAGNOSIS — Z419 Encounter for procedure for purposes other than remedying health state, unspecified: Secondary | ICD-10-CM | POA: Diagnosis not present

## 2024-06-08 DIAGNOSIS — Z113 Encounter for screening for infections with a predominantly sexual mode of transmission: Secondary | ICD-10-CM | POA: Diagnosis not present

## 2024-06-08 DIAGNOSIS — Z114 Encounter for screening for human immunodeficiency virus [HIV]: Secondary | ICD-10-CM | POA: Diagnosis not present

## 2024-06-10 DIAGNOSIS — Z419 Encounter for procedure for purposes other than remedying health state, unspecified: Secondary | ICD-10-CM | POA: Diagnosis not present
